# Patient Record
Sex: Female | Born: 1990
Health system: Southern US, Community
[De-identification: ages and names within clinical notes are randomized; demographics above are authoritative.]

## PROBLEM LIST (undated history)

## (undated) DIAGNOSIS — IMO0001 Reserved for inherently not codable concepts without codable children: Secondary | ICD-10-CM

## (undated) DIAGNOSIS — S92309A Fracture of unspecified metatarsal bone(s), unspecified foot, initial encounter for closed fracture: Secondary | ICD-10-CM

## (undated) DIAGNOSIS — K219 Gastro-esophageal reflux disease without esophagitis: Secondary | ICD-10-CM

## (undated) DIAGNOSIS — N809 Endometriosis, unspecified: Secondary | ICD-10-CM

## (undated) DIAGNOSIS — K449 Diaphragmatic hernia without obstruction or gangrene: Secondary | ICD-10-CM

## (undated) DIAGNOSIS — A64 Unspecified sexually transmitted disease: Secondary | ICD-10-CM

## (undated) DIAGNOSIS — K589 Irritable bowel syndrome without diarrhea: Secondary | ICD-10-CM

## (undated) DIAGNOSIS — F329 Major depressive disorder, single episode, unspecified: Secondary | ICD-10-CM

## (undated) DIAGNOSIS — F419 Anxiety disorder, unspecified: Secondary | ICD-10-CM

## (undated) DIAGNOSIS — F32A Depression, unspecified: Secondary | ICD-10-CM

## (undated) HISTORY — PX: OTHER SURGICAL HISTORY: SHX169

## (undated) HISTORY — DX: Endometriosis, unspecified: N80.9

## (undated) HISTORY — DX: Irritable bowel syndrome, unspecified: K58.9

## (undated) HISTORY — PX: WISDOM TOOTH EXTRACTION: SHX21

## (undated) HISTORY — DX: Unspecified sexually transmitted disease: A64

## (undated) HISTORY — PX: INTRAUTERINE DEVICE INSERTION: SHX323

---

## 2011-06-24 DIAGNOSIS — A64 Unspecified sexually transmitted disease: Secondary | ICD-10-CM

## 2011-06-24 HISTORY — DX: Unspecified sexually transmitted disease: A64

## 2011-11-01 DIAGNOSIS — M79609 Pain in unspecified limb: Secondary | ICD-10-CM | POA: Insufficient documentation

## 2011-11-01 DIAGNOSIS — X500XXA Overexertion from strenuous movement or load, initial encounter: Secondary | ICD-10-CM | POA: Insufficient documentation

## 2011-11-01 DIAGNOSIS — S92309A Fracture of unspecified metatarsal bone(s), unspecified foot, initial encounter for closed fracture: Secondary | ICD-10-CM | POA: Insufficient documentation

## 2011-11-01 DIAGNOSIS — R609 Edema, unspecified: Secondary | ICD-10-CM | POA: Insufficient documentation

## 2011-11-02 ENCOUNTER — Encounter (HOSPITAL_COMMUNITY): Payer: Self-pay | Admitting: *Deleted

## 2011-11-02 ENCOUNTER — Emergency Department (HOSPITAL_COMMUNITY): Payer: BC Managed Care – HMO

## 2011-11-02 ENCOUNTER — Emergency Department (HOSPITAL_COMMUNITY)
Admission: EM | Admit: 2011-11-02 | Discharge: 2011-11-02 | Disposition: A | Discharge status: Home / Self Care | Payer: BC Managed Care – HMO | Attending: Emergency Medicine | Admitting: Emergency Medicine

## 2011-11-02 DIAGNOSIS — S92353A Displaced fracture of fifth metatarsal bone, unspecified foot, initial encounter for closed fracture: Secondary | ICD-10-CM

## 2011-11-02 DIAGNOSIS — S92309A Fracture of unspecified metatarsal bone(s), unspecified foot, initial encounter for closed fracture: Secondary | ICD-10-CM

## 2011-11-02 HISTORY — DX: Fracture of unspecified metatarsal bone(s), unspecified foot, initial encounter for closed fracture: S92.309A

## 2011-11-02 MED ORDER — HYDROCODONE-ACETAMINOPHEN 5-325 MG PO TABS: 2.0000 | ORAL_TABLET | ORAL | Status: AC | PRN

## 2011-11-02 NOTE — ED Notes (Signed)
Educated regarding narcotics and driving.

## 2011-11-02 NOTE — ED Provider Notes (Signed)
History     CSN: [id number]  Arrival date & time 11/01/11  2235   First MD Initiated Contact with Patient 11/02/11 0250      Chief Complaint  Patient presents with  . Fall  . Foot Pain     HPI  History provided by the patient and significant other. Patient is 21 year old female with history of anxiety who presents with complaints of right foot pain and injury. Patient was out of the [employer] performance this evening and on the way home was walking in high heels and twisted her right foot and ankle. She complains of pain and bruising to the top of her right foot. Pain is worse with walking or pressure. Patient reports that she did become anxious and took one of her Clonopin following the injury. She denies any alcohol use or drug use this evening. She has not taken any other medications for her symptoms. Patient has been using ice while waiting in the emergency room. She reports pain is improving some. She denies any numbness or tingling in foot. Patient has no other significant past medical history.    History reviewed. No pertinent past medical history.  History reviewed. No pertinent past surgical history.  History reviewed. No pertinent family history.  History  Substance Use Topics  . Smoking status: Former Games developer  . Smokeless tobacco: Not on file  . Alcohol Use: No    OB History    Grav Para Term Preterm Abortions TAB SAB Ect Mult Living                  Review of Systems  All other systems reviewed and are negative.    Allergies  Review of patient's allergies indicates no known allergies.  Home Medications   Current Outpatient Rx  Name Route Sig Dispense Refill  . BUPROPION HCL 100 MG PO TABS Oral Take 100 mg by mouth 1 day or 1 dose.    Marland Kitchen CLONAZEPAM 0.5 MG PO TABS Oral Take 0.5 mg by mouth 2 (two) times daily as needed.    Marland Kitchen FLUVOXAMINE MALEATE 25 MG PO TABS Oral Take 25 mg by mouth at bedtime.    Marland Kitchen HYDROCODONE-ACETAMINOPHEN 5-325 MG PO TABS Oral  Take 1 tablet by mouth every 6 (six) hours as needed.      BP 125/79  Pulse 81  Temp(Src) 97.9 F (36.6 C) (Oral)  Resp 16  SpO2 100%  LMP 10/19/2011  Physical Exam  Nursing note and vitals reviewed. Constitutional: She is oriented to person, place, and time. She appears well-developed and well-nourished. No distress.  HENT:  Head: Normocephalic.  Cardiovascular: Normal rate and regular rhythm.   Pulmonary/Chest: Effort normal and breath sounds normal.  Abdominal: Soft.  Musculoskeletal: She exhibits edema and tenderness.       Mild bruising and diffuse swelling over the dorsal lateral aspect of right foot. Small abrasion near the fifth MTP joint. Patient has diffuse tenderness over the dorsal aspect of foot. Normal dorsal pedal pulses. Normal sensation and cap refill in toes. No tenderness to palpation over lateral ormedial malleolus.  Neurological: She is alert and oriented to person, place, and time.  Skin: Skin is warm and dry. No rash noted.  Psychiatric: She has a normal mood and affect. Her behavior is normal.    ED Course  Procedures (including critical care time)  Labs Reviewed - No data to display Dg Foot Complete Right  11/02/2011  *RADIOLOGY REPORT*  Clinical Data: The right foot  pain.  Third through fifth metatarsal pain.  RIGHT FOOT COMPLETE - 3+ VIEW  Comparison: None.  Findings: Pseudo Jones fracture of the right fifth metatarsal.  The fourth and third metatarsal bases appear within normal limits.  The tarsometatarsal junction appears normal.  Remainder of the foot appears normal.  Anatomic alignment.  Lateral foot soft tissue swelling is present.  IMPRESSION: Fifth metatarsal base pseudo fracture.  Original Report Authenticated By: Andreas Newport, M.D.     1. Closed fracture of base of fifth metatarsal bone       MDM  3:30AM patient seen and evaluated. Patient in no acute distress        Angus Seller, [place] 11/02/11 2003

## 2011-11-02 NOTE — ED Notes (Signed)
Pt reports she tripped and fell - injuring her rt foot and sustaining an abrasion to her left knee. Pt w/ mild contusion and swelling to dorsal rt foot. CMS intact.

## 2011-11-03 NOTE — ED Provider Notes (Signed)
Medical screening examination/treatment/procedure(s) were performed by non-physician practitioner and as supervising physician I was immediately available for consultation/collaboration.   Vida Roller, MD 11/03/11 [phone]

## 2012-01-12 ENCOUNTER — Encounter (HOSPITAL_BASED_OUTPATIENT_CLINIC_OR_DEPARTMENT_OTHER): Payer: Self-pay | Admitting: *Deleted

## 2012-01-17 NOTE — H&P (Signed)
[facility] [address]   [address] [place], [place] [address] [phone] A Division of [facility]  Loreta Ave, M.D.     Robert A. Thurston Hole, M.D.     Lunette Stands, M.D. Eulas Post, M.D.    Buford Dresser, M.D. Estell Harpin, M.D. Ralene Cork, D.O.          Genene Churn. Barry Dienes, PA-C            Kirstin A. Shepperson, PA-C Janace Litten, OPA-C   RE: Jane Gonzalez, [place]                                [id number]      DOB: [birth year] INITIAL EVALUATION:  11-10-11 Jane Gonzalez comes in as a new patient to the office for evaluation and treatment recommendation for a new injury to her right foot.  Referred from [facility].  She fell wearing high heels with a significant inversion injury, right foot.  Seen in the emergency room on November 02, 2011.  X-rays showing a minimally displaced fracture base of the fifth metatarsal in the Jones fracture area.  She has been in a boot weight bearing in a boot.  Continued pain.  She is concerned because she has now developed ecchymosis down into her toes and up around her foot.  Pain is in the 7 to 8 range.  She comes in today for evaluation and treatment recommendation.  Currently in school at [employer].   History is reviewed, updated and included in the chart.  She is in very good health.  EXAMINATION: General exam is outlined and included in the chart.  This is a healthy 21 year-old female.  Height: 5?0.  Weight: 100 pounds.  Neurovascularly intact upper and lower extremities.  Right foot exquisitely tender base of the fifth metatarsal.  Typical posttraumatic ecchymosis that goes to her heel and up to her toes.  She is neurovascularly intact.  Ankle has good motion and stability.    X-RAYS: I have reviewed the x-rays from [facility] and got new films today.  This is a fifth metatarsal Jones fracture.  Only minimally displaced.  It is however in the region that is a high likelihood of  non-union.  DISPOSITION:  I have explained the diagnosis and all treatment options to Jane Gonzalez in detail.  I have told her that this is a fracture that can heal on its own, but it is at risk for non-union.  If efforts are to be made to get this to heal on its own she needs to be in a well padded boot or cast strict non-weight bearing to get this thing to heal.  Even after 6-8 weeks of casting and being careful, she still has reasonable possibility of non-union or re-fracture in the future.  This approach is not unreasonable, but it demands her adhering to that protocol if it is going to have a chance of healing.  The other option is open reduction internal fixation with an intermedullary screw.  Risks, benefits, ratio of both approaches thoroughly outlined to her in detail.  She really doesn't feel that she is going to be able to be non-weight bearing and good enough to get this to heal with a conservative program.  She is opting for screw fixation, which after talking with her and going through the whole situation with her, I think is actually going to  work out better for her in the long run.  Procedures, risks, benefits and complications reviewed with her in detail.  She is moderately swollen now and I have told her that she really needs to keep her boot on, elevate as much as possible and let her swelling settle down before we proceed.  I filled out all paperwork.  I have answered all of her questions.  I refilled her medication of Percocet that I want her to use sparingly until operative intervention.  We are going to set this up for about 7 days from now. I will see her at that time.  She will call for issues or problems in the interim.  I have again reinforced to her that if she wants to try the conservative approach that is not wrong, but she needs to adhere to that if she is going to go in that direction.    Loreta Ave, M.D.   Electronically verified by Loreta Ave, M.D. DFM:jjh D  11-10-11 T 11-13-11  Rondalyn Belford/[facility] [address]   [address] [place], [place] [address] [phone] A Division of [facility]  Loreta Ave, M.D.     Robert A. Thurston Hole, M.D.     Lunette Stands, M.D. Eulas Post, M.D.    Buford Dresser, M.D. Estell Harpin, M.D. Ralene Cork, D.O.          Genene Churn. Barry Dienes, PA-C            Kirstin A. Shepperson, PA-C [place], OPA-C   RE: Jane Gonzalez, Jane Gonzalez   [id number]      DOB: 1991-06-04 PROGRESS NOTE: 01-05-12 21 year old white female returns for evaluation of right foot pain. She's 8 weeks out from proximal 5th metatarsal fracture continues to have pain and feels like the bone is moving. She wants to proceed with open reduction internal fixation that we previously discussed.  EXAMINATION: Alert and oriented x3 in no acute distress. Gait is antalgic. Right foot has minimal swelling laterally. No bruising. She has exquisite tenderness over proximal 5th metatarsal. She's neurovascularly intact. Skin warm and dry. No increase in respiratory effort.   X-RAYS: Right foot AP lateral and oblique views unchanged fracture not completely healed.  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  DISPOSITION: Best treatment would be open reduction internal fixation right 5th metatarsal fracture. Discussed risks benefits and possible complications in detail. All questions answered. She will be non-weightbearing for 6 weeks. Will also use a bone stimulator post-op.   Loreta Ave, M.D.  Electronically verified by Loreta Ave, M.D. DFM(JMO):kh D 01-07-02 T 01-07-02

## 2012-01-18 ENCOUNTER — Encounter (HOSPITAL_BASED_OUTPATIENT_CLINIC_OR_DEPARTMENT_OTHER): Payer: Self-pay | Admitting: Anesthesiology

## 2012-01-18 ENCOUNTER — Encounter (HOSPITAL_BASED_OUTPATIENT_CLINIC_OR_DEPARTMENT_OTHER): Payer: Self-pay | Admitting: *Deleted

## 2012-01-18 ENCOUNTER — Encounter (HOSPITAL_BASED_OUTPATIENT_CLINIC_OR_DEPARTMENT_OTHER)
Admission: RE | Disposition: A | Discharge status: Home / Self Care | Payer: Self-pay | Source: Ambulatory Visit | Attending: Orthopedic Surgery

## 2012-01-18 ENCOUNTER — Ambulatory Visit (HOSPITAL_BASED_OUTPATIENT_CLINIC_OR_DEPARTMENT_OTHER)
Admission: RE | Admit: 2012-01-18 | Discharge: 2012-01-18 | Disposition: A | Discharge status: Home / Self Care | Payer: BC Managed Care – HMO | Source: Ambulatory Visit | Attending: Orthopedic Surgery | Admitting: Orthopedic Surgery

## 2012-01-18 ENCOUNTER — Ambulatory Visit (HOSPITAL_BASED_OUTPATIENT_CLINIC_OR_DEPARTMENT_OTHER): Payer: BC Managed Care – HMO | Admitting: Anesthesiology

## 2012-01-18 DIAGNOSIS — S8290XS Unspecified fracture of unspecified lower leg, sequela: Secondary | ICD-10-CM | POA: Insufficient documentation

## 2012-01-18 DIAGNOSIS — X58XXXS Exposure to other specified factors, sequela: Secondary | ICD-10-CM | POA: Insufficient documentation

## 2012-01-18 DIAGNOSIS — Z4789 Encounter for other orthopedic aftercare: Secondary | ICD-10-CM

## 2012-01-18 DIAGNOSIS — IMO0002 Reserved for concepts with insufficient information to code with codable children: Secondary | ICD-10-CM | POA: Insufficient documentation

## 2012-01-18 HISTORY — DX: Gastro-esophageal reflux disease without esophagitis: K21.9

## 2012-01-18 HISTORY — DX: Major depressive disorder, single episode, unspecified: F32.9

## 2012-01-18 HISTORY — PX: ORIF TOE FRACTURE: SHX5032

## 2012-01-18 HISTORY — DX: Fracture of unspecified metatarsal bone(s), unspecified foot, initial encounter for closed fracture: S92.309A

## 2012-01-18 HISTORY — DX: Reserved for inherently not codable concepts without codable children: IMO0001

## 2012-01-18 HISTORY — DX: Depression, unspecified: F32.A

## 2012-01-18 HISTORY — DX: Anxiety disorder, unspecified: F41.9

## 2012-01-18 HISTORY — DX: Diaphragmatic hernia without obstruction or gangrene: K44.9

## 2012-01-18 SURGERY — OPEN REDUCTION INTERNAL FIXATION (ORIF) METATARSAL (TOE) FRACTURE
Anesthesia: General | Site: Foot | Laterality: Right | Wound class: Clean

## 2012-01-18 MED ORDER — BUPIVACAINE HCL (PF) 0.5 % IJ SOLN
INTRAMUSCULAR | Status: DC | PRN
  Administered 2012-01-18: 5 mL

## 2012-01-18 MED ORDER — LORAZEPAM 2 MG/ML IJ SOLN: 1.0000 mg | Freq: Once | INTRAMUSCULAR | Status: DC | PRN

## 2012-01-18 MED ORDER — CEFAZOLIN SODIUM 1-5 GM-% IV SOLN
1.0000 g | INTRAVENOUS | Status: AC
  Administered 2012-01-18: 1 g via INTRAVENOUS

## 2012-01-18 MED ORDER — LIDOCAINE HCL (CARDIAC) 20 MG/ML IV SOLN
INTRAVENOUS | Status: DC | PRN
  Administered 2012-01-18: 50 mg via INTRAVENOUS

## 2012-01-18 MED ORDER — PROPOFOL 10 MG/ML IV EMUL
INTRAVENOUS | Status: DC | PRN
  Administered 2012-01-18: 160 mg via INTRAVENOUS

## 2012-01-18 MED ORDER — HYDROMORPHONE HCL PF 1 MG/ML IJ SOLN
0.2500 mg | INTRAMUSCULAR | Status: DC | PRN
  Administered 2012-01-18 (×2): 0.5 mg via INTRAVENOUS

## 2012-01-18 MED ORDER — HYDROCODONE-ACETAMINOPHEN 5-325 MG PO TABS
1.0000 | ORAL_TABLET | Freq: Once | ORAL | Status: AC
  Administered 2012-01-18: 1 via ORAL

## 2012-01-18 MED ORDER — DEXAMETHASONE SODIUM PHOSPHATE 4 MG/ML IJ SOLN
INTRAMUSCULAR | Status: DC | PRN
  Administered 2012-01-18: 8 mg via INTRAVENOUS

## 2012-01-18 MED ORDER — FENTANYL CITRATE 0.05 MG/ML IJ SOLN
INTRAMUSCULAR | Status: DC | PRN
  Administered 2012-01-18: 100 ug via INTRAVENOUS

## 2012-01-18 MED ORDER — LACTATED RINGERS IV SOLN
INTRAVENOUS | Status: DC
  Administered 2012-01-18 (×2): via INTRAVENOUS

## 2012-01-18 MED ORDER — MIDAZOLAM HCL 5 MG/5ML IJ SOLN
INTRAMUSCULAR | Status: DC | PRN
  Administered 2012-01-18: 1 mg via INTRAVENOUS

## 2012-01-18 MED ORDER — ONDANSETRON HCL 4 MG/2ML IJ SOLN
INTRAMUSCULAR | Status: DC | PRN
  Administered 2012-01-18: 4 mg via INTRAVENOUS

## 2012-01-18 MED ORDER — FENTANYL CITRATE 0.05 MG/ML IJ SOLN: 50.0000 ug | INTRAMUSCULAR | Status: DC | PRN

## 2012-01-18 MED ORDER — MIDAZOLAM HCL 2 MG/2ML IJ SOLN: 1.0000 mg | INTRAMUSCULAR | Status: DC | PRN

## 2012-01-18 SURGICAL SUPPLY — 62 items
BANDAGE ELASTIC 4 VELCRO ST LF (GAUZE/BANDAGES/DRESSINGS) ×2 IMPLANT
BANDAGE ELASTIC 6 VELCRO ST LF (GAUZE/BANDAGES/DRESSINGS) ×2 IMPLANT
BENZOIN TINCTURE PRP APPL 2/3 (GAUZE/BANDAGES/DRESSINGS) ×2 IMPLANT
BIT DRILL CANN 3.2MM (BIT) ×1 IMPLANT
BLADE SURG 15 STRL LF DISP TIS (BLADE) ×1 IMPLANT
BLADE SURG 15 STRL SS (BLADE) ×1
BNDG COHESIVE 4X5 TAN STRL (GAUZE/BANDAGES/DRESSINGS) ×2 IMPLANT
BNDG ESMARK 4X9 LF (GAUZE/BANDAGES/DRESSINGS) ×2 IMPLANT
CLOTH BEACON ORANGE TIMEOUT ST (SAFETY) ×2 IMPLANT
COVER MAYO STAND STRL (DRAPES) ×2 IMPLANT
COVER TABLE BACK 60X90 (DRAPES) ×2 IMPLANT
CUFF TOURNIQUET SINGLE 24IN (TOURNIQUET CUFF) ×2 IMPLANT
CUFF TOURNIQUET SINGLE 34IN LL (TOURNIQUET CUFF) IMPLANT
DECANTER SPIKE VIAL GLASS SM (MISCELLANEOUS) IMPLANT
DRAPE EXTREMITY T 121X128X90 (DRAPE) ×2 IMPLANT
DRAPE OEC MINIVIEW 54X84 (DRAPES) ×2 IMPLANT
DRAPE SURG 17X23 STRL (DRAPES) IMPLANT
DRAPE U 20/CS (DRAPES) ×2 IMPLANT
DRAPE U-SHAPE 47X51 STRL (DRAPES) IMPLANT
DRILL BIT CANN 3.2MM (BIT) ×1
DURAPREP 26ML APPLICATOR (WOUND CARE) ×2 IMPLANT
ELECT REM PT RETURN 9FT ADLT (ELECTROSURGICAL)
ELECTRODE REM PT RTRN 9FT ADLT (ELECTROSURGICAL) IMPLANT
GAUZE SPONGE 4X4 12PLY STRL LF (GAUZE/BANDAGES/DRESSINGS) ×2 IMPLANT
GAUZE XEROFORM 1X8 LF (GAUZE/BANDAGES/DRESSINGS) ×2 IMPLANT
GLOVE BIOGEL PI IND STRL 8 (GLOVE) ×2 IMPLANT
GLOVE BIOGEL PI INDICATOR 8 (GLOVE) ×2
GLOVE ORTHO TXT STRL SZ7.5 (GLOVE) ×4 IMPLANT
GLOVE SURG SS PI 8.0 STRL IVOR (GLOVE) ×2 IMPLANT
GOWN BRE IMP PREV XXLGXLNG (GOWN DISPOSABLE) ×4 IMPLANT
GOWN PREVENTION PLUS XLARGE (GOWN DISPOSABLE) ×2 IMPLANT
GUIDEWIRE THREADED 1.6 (WIRE) ×2 IMPLANT
NEEDLE HYPO 25X1 1.5 SAFETY (NEEDLE) ×2 IMPLANT
NS IRRIG 1000ML POUR BTL (IV SOLUTION) ×2 IMPLANT
PACK BASIN DAY SURGERY FS (CUSTOM PROCEDURE TRAY) ×2 IMPLANT
PAD CAST 4YDX4 CTTN HI CHSV (CAST SUPPLIES) ×1 IMPLANT
PADDING CAST ABS 4INX4YD NS (CAST SUPPLIES) ×1
PADDING CAST ABS COTTON 4X4 ST (CAST SUPPLIES) ×1 IMPLANT
PADDING CAST COTTON 4X4 STRL (CAST SUPPLIES) ×1
PADDING CAST COTTON 6X4 STRL (CAST SUPPLIES) ×2 IMPLANT
PENCIL BUTTON HOLSTER BLD 10FT (ELECTRODE) ×2 IMPLANT
SCREW CANN P THRD/40 4.5 (Screw) ×2 IMPLANT
SLEEVE SCD COMPRESS KNEE MED (MISCELLANEOUS) IMPLANT
SPLINT PLASTER CAST XFAST 3X15 (CAST SUPPLIES) IMPLANT
SPLINT PLASTER XTRA FASTSET 3X (CAST SUPPLIES)
SPONGE GAUZE 4X4 12PLY (GAUZE/BANDAGES/DRESSINGS) ×2 IMPLANT
SPONGE LAP 4X18 X RAY DECT (DISPOSABLE) ×2 IMPLANT
STOCKINETTE 4X48 STRL (DRAPES) IMPLANT
STOCKINETTE 6  STRL (DRAPES) ×1
STOCKINETTE 6 STRL (DRAPES) ×1 IMPLANT
STRIP CLOSURE SKIN 1/2X4 (GAUZE/BANDAGES/DRESSINGS) ×2 IMPLANT
SUT ETHILON 3 0 PS 1 (SUTURE) IMPLANT
SUT MON AB 4-0 PC3 18 (SUTURE) IMPLANT
SUT VIC AB 2-0 SH 27 (SUTURE)
SUT VIC AB 2-0 SH 27XBRD (SUTURE) IMPLANT
SUT VIC AB 3-0 FS2 27 (SUTURE) IMPLANT
SYR 20CC LL (SYRINGE) ×2 IMPLANT
SYR BULB 3OZ (MISCELLANEOUS) ×2 IMPLANT
TOWEL OR 17X24 6PK STRL BLUE (TOWEL DISPOSABLE) ×2 IMPLANT
TUBE CONNECTING 20X1/4 (TUBING) IMPLANT
UNDERPAD 30X30 INCONTINENT (UNDERPADS AND DIAPERS) ×2 IMPLANT
WATER STERILE IRR 1000ML POUR (IV SOLUTION) ×2 IMPLANT

## 2012-01-18 NOTE — Interval H&P Note (Signed)
History and Physical Interval Note:  01/18/2012 7:36 AM  Jane Gonzalez  has presented today for surgery, with the diagnosis of right 5th toe fracture  The various methods of treatment have been discussed with the patient and family. After consideration of risks, benefits and other options for treatment, the patient has consented to  Procedure(s) (LRB): OPEN REDUCTION INTERNAL FIXATION (ORIF) METATARSAL (TOE) FRACTURE (Right) as a surgical intervention .  The patients' history has been reviewed, patient examined, no change in status, stable for surgery.  I have reviewed the patients' chart and labs.  Questions were answered to the patient's satisfaction.     MURPHY,DANIEL F

## 2012-01-18 NOTE — Anesthesia Procedure Notes (Signed)
Procedure Name: LMA Insertion Performed by: Lance Coon Pre-anesthesia Checklist: Patient identified, Timeout performed, Emergency Drugs available, Suction available and Patient being monitored Oxygen Delivery Method: Circle system utilized Preoxygenation: Pre-oxygenation with 100% oxygen Intubation Type: IV induction Ventilation: Mask ventilation without difficulty LMA: LMA inserted LMA Size: 3.0 Number of attempts: 1 Placement Confirmation: positive ETCO2 and breath sounds checked- equal and bilateral Dental Injury: Teeth and Oropharynx as per pre-operative assessment

## 2012-01-18 NOTE — Anesthesia Preprocedure Evaluation (Signed)
Anesthesia Evaluation  Patient identified by MRN, date of birth, ID band Patient awake    Reviewed: Allergy & Precautions, H&P , NPO status , Patient's Chart, lab work & pertinent test results  Airway Mallampati: I TM Distance: >3 FB Neck ROM: Full    Dental   Pulmonary    Pulmonary exam normal       Cardiovascular     Neuro/Psych Anxiety Depression    GI/Hepatic   Endo/Other    Renal/GU      Musculoskeletal   Abdominal   Peds  Hematology   Anesthesia Other Findings   Reproductive/Obstetrics                           Anesthesia Physical Anesthesia Plan  ASA: II  Anesthesia Plan: General   Post-op Pain Management:    Induction: Intravenous  Airway Management Planned: LMA  Additional Equipment:   Intra-op Plan:   Post-operative Plan: Extubation in OR  Informed Consent: I have reviewed the patients History and Physical, chart, labs and discussed the procedure including the risks, benefits and alternatives for the proposed anesthesia with the patient or authorized representative who has indicated his/her understanding and acceptance.     Plan Discussed with: CRNA and Surgeon  Anesthesia Plan Comments:         Anesthesia Quick Evaluation

## 2012-01-18 NOTE — Discharge Instructions (Signed)
[facility]  [address] [place], [place] [address] [phone]   Post Anesthesia Home Care Instructions  Activity: Get plenty of rest for the remainder of the day. A responsible adult should stay with you for 24 hours following the procedure.  For the next 24 hours, DO NOT: -Drive a car -Operate machinery -Drink alcoholic beverages -Take any medication unless instructed by your physician -Make any legal decisions or sign important papers.  Meals: Start with liquid foods such as gelatin or soup. Progress to regular foods as tolerated. Avoid greasy, spicy, heavy foods. If nausea and/or vomiting occur, drink only clear liquids until the nausea and/or vomiting subsides. Call your physician if vomiting continues.  Special Instructions/Symptoms: Your throat may feel dry or sore from the anesthesia or the breathing tube placed in your throat during surgery. If this causes discomfort, gargle with warm salt water. The discomfort should disappear within 24 hours.   

## 2012-01-18 NOTE — Brief Op Note (Signed)
01/18/2012  12:58 PM  PATIENT:  Jane Gonzalez  21 y.o. female  PRE-OPERATIVE DIAGNOSIS:  right 5th toe fracture  POST-OPERATIVE DIAGNOSIS:  right 5th toe fracture  PROCEDURE:  Procedure(s) (LRB): OPEN REDUCTION INTERNAL FIXATION (ORIF) METATARSAL (TOE) FRACTURE (Right)  SURGEON:  Surgeon(s) and Role:    * Loreta Ave, MD - Primary  PHYSICIAN ASSISTANT: Zonia Kief M    ANESTHESIA:   general  EBL:  Total I/O In: 1150 [I.V.:1150] Out: -     SPECIMEN:  No Specimen  DISPOSITION OF SPECIMEN:  N/A  COUNTS:  YES  TOURNIQUET:   Total Tourniquet Time Documented: Thigh (Right) - 26 minutes   PATIENT DISPOSITION:  PACU - hemodynamically stable.

## 2012-01-18 NOTE — Anesthesia Postprocedure Evaluation (Signed)
  Anesthesia Post-op Note  Patient: Jane Gonzalez  Procedure(s) Performed: Procedure(s) (LRB): OPEN REDUCTION INTERNAL FIXATION (ORIF) METATARSAL (TOE) FRACTURE (Right)  Patient Location: PACU  Anesthesia Type: General  Level of Consciousness: awake  Airway and Oxygen Therapy: Patient Spontanous Breathing  Post-op Pain: mild  Post-op Assessment: Post-op Vital signs reviewed, Patient's Cardiovascular Status Stable, Respiratory Function Stable, Patent Airway, No signs of Nausea or vomiting and Pain level controlled  Post-op Vital Signs: stable  Complications: No apparent anesthesia complications

## 2012-01-18 NOTE — Transfer of Care (Signed)
Immediate Anesthesia Transfer of Care Note  Patient: Jane Gonzalez  Procedure(s) Performed: Procedure(s) (LRB): OPEN REDUCTION INTERNAL FIXATION (ORIF) METATARSAL (TOE) FRACTURE (Right)  Patient Location: PACU  Anesthesia Type: General  Level of Consciousness: awake and sedated  Airway & Oxygen Therapy: Patient Spontanous Breathing and Patient connected to face mask oxygen  Post-op Assessment: Report given to PACU RN and Post -op Vital signs reviewed and stable  Post vital signs: Reviewed and stable  Complications: No apparent anesthesia complications

## 2012-01-20 NOTE — Op Note (Signed)
NAME:  Jane Gonzalez, Jane Gonzalez                     ACCOUNT NO.:  MEDICAL RECORD NO.:  [id number]  LOCATION:                                 FACILITY:  PHYSICIAN:  Loreta Ave, M.D. DATE OF BIRTH:  June 18, 1991  DATE OF PROCEDURE:  01/18/2012 DATE OF DISCHARGE:                              OPERATIVE REPORT   PREOPERATIVE DIAGNOSIS:  Delayed union of proximal shaft fracture, 5th metatarsal, right foot.  POSTOPERATIVE DIAGNOSIS:  Delayed union of proximal shaft fracture, 5th metatarsal, right foot.  PROCEDURE:  Open reduction and internal fixation, 5th metatarsal delayed union with multiple drilling across the fracture site.  Fixation with a partially threaded, cannulated 4.5-mm screw, 40 mm long.  SURGEON:  Loreta Ave, MD  ASSISTANT:  Zonia Kief, PA  ANESTHESIA:  General.  BLOOD LOSS:  Minimal.  SPECIMENS:  None.  CULTURES:  None.  COMPLICATION:  None.  DRESSINGS:  Soft compressive short leg splint.  TOURNIQUET TIME:  35 minutes.  PROCEDURE:  The patient was brought to the operating room, placed on the operating table in supine position.  After adequate anesthesia has been obtained, tourniquet was applied to the upper aspect of the right leg. Prepped draped in the usual sterile fashion.  Exsanguinated with elevation and Esmarch, tourniquet inflated to 300 mmHg.  Fluoroscopic guidance was used throughout.  Longitudinal incision at the base of 5th metatarsal, right foot.  Blunt dissection was used to expose the end of the metatarsal.  Fluoroscopic guidance.  A guidewire was then passed across the fracture multiple times to open this to improve healing of the delayed union.  I then put a guidewire down the shaft, confirming good position on all views.  Predrilled.  Countersunk.  Fixed with a 40- mm, partially threaded, cannulated 4.5-mm screw.  Nice, firm, solid, stable fixation and anatomic alignment was confirmed.  Wound was irrigated, closed with nylon.  Sterile  compressive dressing was applied. Short-leg splint was applied.  Tourniquet was deflated and removed. Anesthesia reversed.  Brought to the recovery room.  Tolerated the surgery well with no complications.     Loreta Ave, M.D.     DFM/MEDQ  D:  01/18/2012  T:  01/19/2012  Job:  [id number]

## 2012-01-23 ENCOUNTER — Encounter (HOSPITAL_BASED_OUTPATIENT_CLINIC_OR_DEPARTMENT_OTHER): Payer: Self-pay | Admitting: Orthopedic Surgery

## 2013-04-17 ENCOUNTER — Encounter: Payer: Self-pay | Admitting: Obstetrics and Gynecology

## 2013-04-21 ENCOUNTER — Encounter: Payer: Self-pay | Admitting: Obstetrics and Gynecology

## 2013-04-21 ENCOUNTER — Ambulatory Visit (INDEPENDENT_AMBULATORY_CARE_PROVIDER_SITE_OTHER): Payer: BC Managed Care – PPO | Admitting: Obstetrics and Gynecology

## 2013-04-21 VITALS — BP 108/60 | HR 66 | Resp 12 | Ht 61.0 in | Wt 122.2 lb

## 2013-04-21 DIAGNOSIS — Z0000 Encounter for general adult medical examination without abnormal findings: Secondary | ICD-10-CM

## 2013-04-21 DIAGNOSIS — Z01419 Encounter for gynecological examination (general) (routine) without abnormal findings: Secondary | ICD-10-CM

## 2013-04-21 LAB — POCT URINALYSIS DIPSTICK: Leukocytes, UA: NEGATIVE

## 2013-04-21 LAB — STD PANEL
HIV: NONREACTIVE
Hepatitis B Surface Ag: NEGATIVE

## 2013-04-21 NOTE — Progress Notes (Signed)
22 y.o. G0P0000 SingleCaucasianF here for annual exam.  Had implanon put in around Oct 2013.  Menses are irregular, some spotting, sometimes no bleeding.  Requests STD testing.    Patient's last menstrual period was 03/16/2013.          Sexually active: yes  The current method of family planning is Implannon.    Exercising: yes  pt has cardio workout routine Smoker:  yes  Health Maintenance: Pap:  never History of abnormal Pap:  no MMG:  never Colonoscopy:  2012 BMD:   never TDaP:  2013, per pt.  Screening Labs: , Hb today: 13.3 , Urine today:    reports that she has been smoking.  She has never used smokeless tobacco. She reports that she drinks about 1.5 ounces of alcohol per week. She reports that she does not use illicit drugs.  Past Medical History  Diagnosis Date  . Hiatal hernia   . Reflux   . Anxiety   . Depression   . Fx metatarsal 11/02/2011    right 5th toe  . IBS (irritable bowel syndrome)   . STD (sexually transmitted disease) 06/2011    + chlamydia    Past Surgical History  Procedure Laterality Date  . Wisdom tooth extraction    . Orif toe fracture  01/18/2012    Procedure: OPEN REDUCTION INTERNAL FIXATION (ORIF) METATARSAL (TOE) FRACTURE;  Surgeon: Loreta Ave, MD;  Location: [facility];  Service: Orthopedics;  Laterality: Right;  right 5th toe  . Implannon      Current Outpatient Prescriptions  Medication Sig Dispense Refill  . clonazePAM (KLONOPIN) 1 MG tablet Take 1 mg by mouth.      . DULoxetine (CYMBALTA) 30 MG capsule Take 30 mg by mouth daily.      Marland Kitchen etonogestrel (IMPLANON) 68 MG IMPL implant Inject 1 each into the skin once.       No current facility-administered medications for this visit.    No family history on file.  ROS:  Pertinent items are noted in HPI.  Otherwise, a comprehensive ROS was negative.  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    Exam:   BP 108/60  Pulse 66   Resp 12  Ht 5\' 1"  (1.549 m)  Wt 122 lb 3.2 oz (55.43 kg)  BMI 23.1 kg/m2  LMP 03/16/2013  Weight change: @WEIGHTCHANGE @ Height:   Height: 5\' 1"  (154.9 cm)  Ht Readings from Last 3 Encounters:  04/21/13 5\' 1"  (1.549 m)  01/12/12 5' (1.524 m) (5%*, Z = -1.68)  01/12/12 5' (1.524 m) (5%*, Z = -1.68)   * Growth percentiles are based on CDC 2-20 Years data.    General appearance: alert, cooperative and appears stated age.  Lime green and bright purple hair.  Piercings through nasal septum and cheeks on face, and both nipples.   Head: Normocephalic, without obvious abnormality, atraumatic Neck: no adenopathy, supple, symmetrical, trachea midline and thyroid normal to inspection and palpation Lungs: clear to auscultation bilaterally Breasts: normal appearance, no masses or tenderness, Inspection negative, No nipple retraction or dimpling, Normal to palpation without dominant masses Heart: regular rate and rhythm Abdomen: soft, non-tender; bowel sounds normal; no masses,  no organomegaly Extremities: extremities normal, atraumatic, no cyanosis or edema Skin: Skin color, texture, turgor normal. No rashes or lesions Lymph nodes: Cervical, supraclavicular, and axillary nodes normal. No abnormal inguinal nodes palpated Neurologic: Grossly normal   Pelvic: External genitalia:  no lesions              Urethra:  normal appearing urethra with no masses, tenderness or lesions              Bartholins and Skenes: normal                 Vagina: normal appearing vagina with normal color and discharge, no lesions              Cervix: no lesions              Pap taken: no Bimanual Exam:  Uterus:  normal size, contour, position, consistency, mobility, non-tender              Adnexa: normal adnexa               Rectovaginal: Confirms               Anus:  normal sphincter tone, no lesions  A:  Well Woman with normal exam, implanon             Requests STD check  P:   mammogram counseled on breast  self exam, STD prevention, diet and exercise return annually or prn.  Check GC, CHL, HIV, RPR, Hep B  An After Visit Summary was printed and given to the patient.

## 2013-04-21 NOTE — Patient Instructions (Addendum)
EXERCISE AND DIET:  We recommended that you start or continue a regular exercise program for good health. Regular exercise means any activity that makes your heart beat faster and makes you sweat.  We recommend exercising at least 30 minutes per day at least 3 days a week, preferably 4 or 5.  We also recommend a diet low in fat and sugar.  Inactivity, poor dietary choices and obesity can cause diabetes, heart attack, stroke, and kidney damage, among others.    ALCOHOL AND SMOKING:  Women should limit their alcohol intake to no more than 7 drinks/beers/glasses of wine (combined, not each!) per week. Moderation of alcohol intake to this level decreases your risk of breast cancer and liver damage. And of course, no recreational drugs are part of a healthy lifestyle.  And absolutely no smoking or even second hand smoke. Most people know smoking can cause heart and lung diseases, but did you know it also contributes to weakening of your bones? Aging of your skin?  Yellowing of your teeth and nails?  CALCIUM AND VITAMIN D:  Adequate intake of calcium and Vitamin D are recommended.  The recommendations for exact amounts of these supplements seem to change often, but generally speaking 600 mg of calcium (either carbonate or citrate) and 800 units of Vitamin D per day seems prudent. Certain women may benefit from higher intake of Vitamin D.  If you are among these women, your doctor will have told you during your visit.    PAP SMEARS:  Pap smears, to check for cervical cancer or precancers,  have traditionally been done yearly, although recent scientific advances have shown that most women can have pap smears less often.  However, every woman still should have a physical exam from her gynecologist every year. It will include a breast check, inspection of the vulva and vagina to check for abnormal growths or skin changes, a visual exam of the cervix, and then an exam to evaluate the size and shape of the uterus and  ovaries.  And after 21 years of age, a rectal exam is indicated to check for rectal cancers. We will also provide age appropriate advice regarding health maintenance, like when you should have certain vaccines, screening for sexually transmitted diseases, bone density testing, colonoscopy, mammograms, etc.      

## 2013-04-22 LAB — GC/CHLAMYDIA PROBE AMP, URINE: Chlamydia, Swab/Urine, PCR: NEGATIVE

## 2013-04-23 ENCOUNTER — Telehealth: Payer: Self-pay | Admitting: *Deleted

## 2013-04-23 NOTE — Telephone Encounter (Signed)
Message copied by Lorraine Lax on Wed Apr 23, 2013 10:18 AM ------      Message from: Meredeth Ide P      Created: Tue Apr 22, 2013  2:19 PM       Let pt know these are all negative. ------

## 2013-04-23 NOTE — Telephone Encounter (Signed)
Left Message To Call Back  

## 2013-04-24 NOTE — Telephone Encounter (Signed)
Pt notified 04/23/13 see labs

## 2014-04-03 ENCOUNTER — Encounter: Payer: Self-pay | Admitting: Certified Nurse Midwife

## 2014-04-03 ENCOUNTER — Ambulatory Visit (INDEPENDENT_AMBULATORY_CARE_PROVIDER_SITE_OTHER): Payer: BC Managed Care – PPO | Admitting: Certified Nurse Midwife

## 2014-04-03 ENCOUNTER — Ambulatory Visit: Payer: BC Managed Care – PPO | Admitting: Certified Nurse Midwife

## 2014-04-03 VITALS — Ht 61.0 in | Wt 127.0 lb

## 2014-04-03 DIAGNOSIS — N390 Urinary tract infection, site not specified: Secondary | ICD-10-CM

## 2014-04-03 DIAGNOSIS — Z202 Contact with and (suspected) exposure to infections with a predominantly sexual mode of transmission: Secondary | ICD-10-CM

## 2014-04-03 LAB — POCT URINALYSIS DIPSTICK
BILIRUBIN UA: NEGATIVE
Blood, UA: NEGATIVE
Glucose, UA: NEGATIVE
KETONES UA: NEGATIVE
NITRITE UA: NEGATIVE
PH UA: 5
PROTEIN UA: NEGATIVE
Urobilinogen, UA: NEGATIVE

## 2014-04-03 MED ORDER — NITROFURANTOIN MONOHYD MACRO 100 MG PO CAPS: 100.0000 mg | ORAL_CAPSULE | Freq: Two times a day (BID) | ORAL | Status: DC

## 2014-04-03 NOTE — Progress Notes (Signed)
23 y.o.single white female g0p0 here with complaint of UTI, with onset  on 4-5 days ago. Patient complaining of urinary frequency/urgency/ and slight pain with urination. Patient denies fever, chills, nausea or back pain. No new personal products. Patient feels not related to sexual activity. Denies any vaginal symptoms or new personal products. Contraception is Implanon. Patient desires STD screening.   O: Healthy female WDWN Affect: Normal, orientation x 3 Skin : warm and dry CVAT: negative bilateral Abdomen: positive for suprapubic tenderness  Pelvic exam: External genital area: normal, no lesions Bladder,Urethra, Urethral meatus: tender Vagina: normal vaginal discharge, normal appearance  Wet prep not taken Cervix: normal, non tender Uterus:normal,non tender Adnexa: normal non tender, no fullness or masses   A: UTI Poct urine-wbc 1+ STD screening Aex due this month  P: Reviewed findings of UTI and etiology. ZO:XWRUEAVWRx:Macrobid UJW:JXBJYLab:Urine micro, culture Reviewed warning signs and symptoms of UTI Encouraged to limit soda, tea, and coffee, increase water intake Labs: GC,Chlamydia,HIV,RPR Discussed STD prevention with condom use. Discussed aex due 04/21/14. Patient plans to schedule while she is here.  RV 2 week if TOC needed for positive culture  RV prn

## 2014-04-03 NOTE — Patient Instructions (Signed)
Urinary Tract Infection  Urinary tract infections (UTIs) can develop anywhere along your urinary tract. Your urinary tract is your body's drainage system for removing wastes and extra water. Your urinary tract includes two kidneys, two ureters, a bladder, and a urethra. Your kidneys are a pair of bean-shaped organs. Each kidney is about the size of your fist. They are located below your ribs, one on each side of your spine.  CAUSES  Infections are caused by microbes, which are microscopic organisms, including fungi, viruses, and bacteria. These organisms are so small that they can only be seen through a microscope. Bacteria are the microbes that most commonly cause UTIs.  SYMPTOMS   Symptoms of UTIs may vary by age and gender of the patient and by the location of the infection. Symptoms in young women typically include a frequent and intense urge to urinate and a painful, burning feeling in the bladder or urethra during urination. Older women and men are more likely to be tired, shaky, and weak and have muscle aches and abdominal pain. A fever may mean the infection is in your kidneys. Other symptoms of a kidney infection include pain in your back or sides below the ribs, nausea, and vomiting.  DIAGNOSIS  To diagnose a UTI, your caregiver will ask you about your symptoms. Your caregiver also will ask to provide a urine sample. The urine sample will be tested for bacteria and white blood cells. White blood cells are made by your body to help fight infection.  TREATMENT   Typically, UTIs can be treated with medication. Because most UTIs are caused by a bacterial infection, they usually can be treated with the use of antibiotics. The choice of antibiotic and length of treatment depend on your symptoms and the type of bacteria causing your infection.  HOME CARE INSTRUCTIONS   If you were prescribed antibiotics, take them exactly as your caregiver instructs you. Finish the medication even if you feel better after you  have only taken some of the medication.   Drink enough water and fluids to keep your urine clear or pale yellow.   Avoid caffeine, tea, and carbonated beverages. They tend to irritate your bladder.   Empty your bladder often. Avoid holding urine for long periods of time.   Empty your bladder before and after sexual intercourse.   After a bowel movement, women should cleanse from front to back. Use each tissue only once.  SEEK MEDICAL CARE IF:    You have back pain.   You develop a fever.   Your symptoms do not begin to resolve within 3 days.  SEEK IMMEDIATE MEDICAL CARE IF:    You have severe back pain or lower abdominal pain.   You develop chills.   You have nausea or vomiting.   You have continued burning or discomfort with urination.  MAKE SURE YOU:    Understand these instructions.   Will watch your condition.   Will get help right away if you are not doing well or get worse.  Document Released: 07/19/2005 Document Revised: 04/09/2012 Document Reviewed: 11/17/2011  [employer] Patient Information 2014 [employer].

## 2014-04-04 LAB — RPR

## 2014-04-04 LAB — URINALYSIS, MICROSCOPIC ONLY
Bacteria, UA: NONE SEEN
CASTS: NONE SEEN
Crystals: NONE SEEN
SQUAMOUS EPITHELIAL / LPF: NONE SEEN

## 2014-04-04 LAB — HIV ANTIBODY (ROUTINE TESTING W REFLEX): HIV: NONREACTIVE

## 2014-04-04 NOTE — Progress Notes (Signed)
Reviewed personally.  M. Suzanne Estephan Gallardo, MD.  

## 2014-04-05 LAB — URINE CULTURE

## 2014-04-08 LAB — IPS N GONORRHOEA AND CHLAMYDIA BY PCR

## 2014-04-08 NOTE — Addendum Note (Signed)
Addended by: Verner CholLEONARD, DEBORAH S on: 04/08/2014 01:26 PM   Modules accepted: Orders

## 2014-04-22 ENCOUNTER — Ambulatory Visit (INDEPENDENT_AMBULATORY_CARE_PROVIDER_SITE_OTHER): Payer: BC Managed Care – PPO | Admitting: *Deleted

## 2014-04-22 VITALS — BP 108/60 | Temp 98.3°F | Resp 16 | Ht 61.0 in | Wt 124.0 lb

## 2014-04-22 DIAGNOSIS — N390 Urinary tract infection, site not specified: Secondary | ICD-10-CM

## 2014-04-22 NOTE — Progress Notes (Signed)
Patient in today for TOC. Patient denies any Sx and states she completed Abx. Advised pt to call if Sx come back. - Pt agreed and states she has no concerns.

## 2014-04-23 LAB — URINALYSIS, MICROSCOPIC ONLY
BACTERIA UA: NONE SEEN
CRYSTALS: NONE SEEN
SQUAMOUS EPITHELIAL / LPF: NONE SEEN

## 2014-04-24 LAB — URINE CULTURE: Colony Count: >=100000

## 2014-05-04 ENCOUNTER — Telehealth: Payer: Self-pay

## 2014-05-04 NOTE — Telephone Encounter (Signed)
lmtcb

## 2014-05-04 NOTE — Telephone Encounter (Signed)
Message copied by Eliezer BottomJOHNSON, Crystallee Werden J on Mon May 04, 2014  4:31 PM ------      Message from: Verner CholLEONARD, DEBORAH S      Created: Wed Apr 29, 2014 10:45 AM       Urine micro negative with previous note ------

## 2014-05-05 NOTE — Telephone Encounter (Signed)
Pt is returning a call to Joy 

## 2014-05-05 NOTE — Telephone Encounter (Signed)
Patient notified of results as written by provider 

## 2014-09-01 ENCOUNTER — Ambulatory Visit (INDEPENDENT_AMBULATORY_CARE_PROVIDER_SITE_OTHER): Payer: BC Managed Care – PPO | Admitting: Certified Nurse Midwife

## 2014-09-01 ENCOUNTER — Encounter: Payer: Self-pay | Admitting: Certified Nurse Midwife

## 2014-09-01 VITALS — BP 110/60 | HR 64 | Temp 97.8°F | Resp 16 | Ht 61.0 in | Wt 119.0 lb

## 2014-09-01 DIAGNOSIS — N390 Urinary tract infection, site not specified: Secondary | ICD-10-CM

## 2014-09-01 DIAGNOSIS — Z113 Encounter for screening for infections with a predominantly sexual mode of transmission: Secondary | ICD-10-CM

## 2014-09-01 DIAGNOSIS — R319 Hematuria, unspecified: Secondary | ICD-10-CM

## 2014-09-01 LAB — POCT URINALYSIS DIPSTICK
Bilirubin, UA: NEGATIVE
Glucose, UA: NEGATIVE
Ketones, UA: NEGATIVE
NITRITE UA: NEGATIVE
PH UA: 5
PROTEIN UA: NEGATIVE
UROBILINOGEN UA: NEGATIVE

## 2014-09-01 MED ORDER — NITROFURANTOIN MONOHYD MACRO 100 MG PO CAPS: 100.0000 mg | ORAL_CAPSULE | Freq: Two times a day (BID) | ORAL | Status: DC

## 2014-09-01 NOTE — Patient Instructions (Signed)

## 2014-09-01 NOTE — Progress Notes (Signed)
Reviewed personally.  M. Suzanne Linzie Criss, MD.  

## 2014-09-01 NOTE — Progress Notes (Signed)
[age] y.o. single white female g0p0 here with complaint of UTI, with onset  on 2-3 days. Patient complaining of urinary frequency/urgency/ and pain with urination. Patient denies fever, chills, nausea or back pain. No new personal products. Patient feels not related to sexual activity. Denies any vaginal symptoms or new personal products. Contraception is Nexplanon. Would like STD screening.  O: Healthy female WDWN Affect: Normal, orientation x 3 Skin : warm and dry CVAT: negative bilateral Abdomen: positive for suprapubic tenderness  Pelvic exam: External genital area: normal, no lesions Bladder,Urethra, Urethral meatus: tender Vagina: normal vaginal discharge, normal appearance  Wet prep Cervix: normal, non tender Uterus:normal,non tender Adnexa: normal non tender, no fullness or masses  Poct urine-rbc tr, wbc 2+ A: UTI STD screening  P: Reviewed findings of UTI and need for treatment. ZO:XWRUEAVWRx:Macrobid see order UJW:JXBJYLab:Urine micro, culture Reviewed warning signs and symptoms of UTI and need to advise if occurs. Encouraged to limit soda, tea, and coffee RV 2 week if TOC needed for positive culture Labs: GC,Chlamydia, HIV,RPR Hep B Discussed prevention with monogamous relationship and condom use.  RV prn

## 2014-09-02 LAB — STD PANEL
HIV: NONREACTIVE
Hepatitis B Surface Ag: NEGATIVE

## 2014-09-02 LAB — URINALYSIS, MICROSCOPIC ONLY
BACTERIA UA: NONE SEEN
CASTS: NONE SEEN
CRYSTALS: NONE SEEN
SQUAMOUS EPITHELIAL / LPF: NONE SEEN

## 2014-09-03 LAB — URINE CULTURE

## 2014-09-03 LAB — IPS N GONORRHOEA AND CHLAMYDIA BY PCR

## 2014-09-04 ENCOUNTER — Other Ambulatory Visit: Payer: Self-pay | Admitting: Certified Nurse Midwife

## 2014-09-04 DIAGNOSIS — N390 Urinary tract infection, site not specified: Secondary | ICD-10-CM

## 2015-06-15 ENCOUNTER — Telehealth: Payer: Self-pay | Admitting: Certified Nurse Midwife

## 2015-06-15 NOTE — Telephone Encounter (Signed)
Patient is asking to come in for "testing". Patient is also interested in getting an IUD. Last seen 09/01/14.

## 2015-06-15 NOTE — Telephone Encounter (Signed)
Spoke with patient. Patient states that she would like to be seen for pap smear, std testing, and to discuss removal of her nexplanon and replacement. Advised will need to be seen for aex. Last aex was 04/21/2013. Denies any current problems or symptoms. States would like STD testing as a "precaution." Aex scheduled for 06/17/2015 at 10:30am with Verner Chol CNM. Patient is agreeable to date and time. Aware nexplanon will not be removed and replaced at this appointment. Advised will need to speak with Verner Chol CNM regarding scheduling of this appointment as her nexplanon is expired and will have to be replaced on her cycle. Patient is agreeable.  Routing to provider for final review. Patient agreeable to disposition. Will close encounter.   Patient aware provider will review message and nurse will return call if any additional advice or change of disposition.

## 2015-06-17 ENCOUNTER — Ambulatory Visit (INDEPENDENT_AMBULATORY_CARE_PROVIDER_SITE_OTHER): Payer: BLUE CROSS/BLUE SHIELD | Admitting: Certified Nurse Midwife

## 2015-06-17 ENCOUNTER — Encounter: Payer: Self-pay | Admitting: Certified Nurse Midwife

## 2015-06-17 VITALS — BP 120/80 | HR 72 | Resp 20 | Ht 61.25 in | Wt 139.0 lb

## 2015-06-17 DIAGNOSIS — Z01419 Encounter for gynecological examination (general) (routine) without abnormal findings: Secondary | ICD-10-CM

## 2015-06-17 DIAGNOSIS — Z308 Encounter for other contraceptive management: Secondary | ICD-10-CM | POA: Diagnosis not present

## 2015-06-17 DIAGNOSIS — Z0000 Encounter for general adult medical examination without abnormal findings: Secondary | ICD-10-CM | POA: Diagnosis not present

## 2015-06-17 DIAGNOSIS — Z124 Encounter for screening for malignant neoplasm of cervix: Secondary | ICD-10-CM | POA: Diagnosis not present

## 2015-06-17 LAB — POCT URINALYSIS DIPSTICK
Bilirubin, UA: NEGATIVE
Blood, UA: NEGATIVE
GLUCOSE UA: NEGATIVE
KETONES UA: NEGATIVE
LEUKOCYTES UA: NEGATIVE
Nitrite, UA: NEGATIVE
PROTEIN UA: NEGATIVE
Urobilinogen, UA: NEGATIVE
pH, UA: 5

## 2015-06-17 LAB — HEMOGLOBIN, FINGERSTICK: Hemoglobin, fingerstick: 13 g/dL (ref 12.0–16.0)

## 2015-06-17 NOTE — Progress Notes (Signed)
24 y.o. G0P0000 Single  Caucasian Fe here for annual exam. Periods normal with Nexplanon. Due for removal in 10/16 and would like to have re-insertion. Request STD screening,no concerns. Busy with school. Sees PCP prn. No health issues today.  Patient's last menstrual period was 06/16/2015.          Sexually active: Yes.    The current method of family planning is implanon.    Exercising: No.  exercise Smoker:  no  Health Maintenance: Pap:  none MMG:  none Colonoscopy: none BMD:   none TDaP:  2013 Labs: Poct urine-neg, Hgb-13.0 Self breast exam: not done   reports that she has been smoking.  She has never used smokeless tobacco. She reports that she drinks about 0.6 - 1.2 oz of alcohol per week. She reports that she uses illicit drugs (Marijuana).  Past Medical History  Diagnosis Date  . Hiatal hernia   . Reflux   . Anxiety   . Depression   . Fx metatarsal 11/02/2011    right 5th toe  . IBS (irritable bowel syndrome)   . STD (sexually transmitted disease) 06/2011    + chlamydia    Past Surgical History  Procedure Laterality Date  . Wisdom tooth extraction    . Orif toe fracture  01/18/2012    Procedure: OPEN REDUCTION INTERNAL FIXATION (ORIF) METATARSAL (TOE) FRACTURE;  Surgeon: Loreta Ave, MD;  Location: [facility];  Service: Orthopedics;  Laterality: Right;  right 5th toe  . Implannon      put in around 10/13    Current Outpatient Prescriptions  Medication Sig Dispense Refill  . clonazePAM (KLONOPIN) 1 MG tablet Take 1 mg by mouth.    . DULoxetine (CYMBALTA) 30 MG capsule Take 30 mg by mouth daily.    Marland Kitchen etonogestrel (IMPLANON) 68 MG IMPL implant Inject 1 each into the skin once.    . Vilazodone HCl (VIIBRYD) 20 MG TABS Take by mouth daily.     No current facility-administered medications for this visit.    Family History  Problem Relation Age of Onset  . Cancer Father     lypmh nodes,tonsils & tongue    ROS:  Pertinent items are noted in  HPI.  Otherwise, a comprehensive ROS was negative.  Exam:   BP 120/80 mmHg  Pulse 72  Resp 20  Ht 5' 1.25" (1.556 m)  Wt 139 lb (63.05 kg)  BMI 26.04 kg/m2  LMP 06/16/2015 Height: 5' 1.25" (155.6 cm) Ht Readings from Last 3 Encounters:  06/17/15 5' 1.25" (1.556 m)  09/01/14 5\' 1"  (1.549 m)  04/22/14 5\' 1"  (1.549 m)    General appearance: alert, cooperative and appears stated age Head: Normocephalic, without obvious abnormality, atraumatic Neck: no adenopathy, supple, symmetrical, trachea midline and thyroid normal to inspection and palpation Lungs: clear to auscultation bilaterally Breasts: normal appearance, no masses or tenderness, No nipple retraction or dimpling, No nipple discharge or bleeding, No axillary or supraclavicular adenopathy Heart: regular rate and rhythm Abdomen: soft, non-tender; no masses,  no organomegaly Extremities: extremities normal, atraumatic, no cyanosis or edema, Nexplanon palpated intact in right arm Skin: Skin color, texture, turgor normal. No rashes or lesions, piercing and tattoos noted Lymph nodes: Cervical, supraclavicular, and axillary nodes normal. No abnormal inguinal nodes palpated Neurologic: Grossly normal   Pelvic: External genitalia:  no lesions              Urethra:  normal appearing urethra with no masses, tenderness or lesions  Bartholin's and Skene's: normal                 Vagina: normal appearing vagina with normal color and discharge, no lesions              Cervix: normal              Pap taken: Yes.   Bimanual Exam:  Uterus:  normal size, contour, position, consistency, mobility, non-tender              Adnexa: normal adnexa and no mass, fullness, tenderness               Rectovaginal: Confirms               Anus:  normal appearance  Chaperone present: yes  A:  Well Woman with normal exam  Contraception Nexplanon due for removal 10/16 and desires re insertion  STD screening  P:   Reviewed health and wellness  pertinent to exam   Patient aware she will be called and scheduled.  Lab HIV,RPR,GC,Chlamydia, Affirm  Pap smear as above with HPV reflex   counseled on breast self exam, STD prevention, HIV risk factors and prevention, adequate intake of calcium and vitamin D, diet and exercise  return annually or prn  An After Visit Summary was printed and given to the patient.

## 2015-06-17 NOTE — Patient Instructions (Signed)
General topics  Next pap or exam is  due in 1 year Take a Women's multivitamin Take 1200 mg. of calcium daily - prefer dietary If any concerns in interim to call back  Breast Self-Awareness Practicing breast self-awareness may pick up problems early, prevent significant medical complications, and possibly save your life. By practicing breast self-awareness, you can become familiar with how your breasts look and feel and if your breasts are changing. This allows you to notice changes early. It can also offer you some reassurance that your breast health is good. One way to learn what is normal for your breasts and whether your breasts are changing is to do a breast self-exam. If you find a lump or something that was not present in the past, it is best to contact your caregiver right away. Other findings that should be evaluated by your caregiver include nipple discharge, especially if it is bloody; skin changes or reddening; areas where the skin seems to be pulled in (retracted); or new lumps and bumps. Breast pain is seldom associated with cancer (malignancy), but should also be evaluated by a caregiver. BREAST SELF-EXAM The best time to examine your breasts is 5 7 days after your menstrual period is over.  ExitCare Patient Information 2013 [employer].   Exercise to Stay Healthy Exercise helps you become and stay healthy. EXERCISE IDEAS AND TIPS Choose exercises that:  You enjoy.  Fit into your day. You do not need to exercise really hard to be healthy. You can do exercises at a slow or medium level and stay healthy. You can:  Stretch before and after working out.  Try yoga, Pilates, or tai chi.  Lift weights.  Walk fast, swim, jog, run, climb stairs, bicycle, dance, or rollerskate.  Take aerobic classes. Exercises that burn about 150 calories:  Running 1  miles in 15 minutes.  Playing volleyball for 45 to 60 minutes.  Washing and waxing a car for 45 to 60  minutes.  Playing touch football for 45 minutes.  Walking 1  miles in 35 minutes.  Pushing a stroller 1  miles in 30 minutes.  Playing basketball for 30 minutes.  Raking leaves for 30 minutes.  Bicycling 5 miles in 30 minutes.  Walking 2 miles in 30 minutes.  Dancing for 30 minutes.  Shoveling snow for 15 minutes.  Swimming laps for 20 minutes.  Walking up stairs for 15 minutes.  Bicycling 4 miles in 15 minutes.  Gardening for 30 to 45 minutes.  Jumping rope for 15 minutes.  Washing windows or floors for 45 to 60 minutes. Document Released: 11/11/2010 Document Revised: 01/01/2012 Document Reviewed: 11/11/2010 ExitCare Patient Information 2013 [employer].   Other topics ( that may be useful information):    Sexually Transmitted Disease Sexually transmitted disease (STD) refers to any infection that is passed from person to person during sexual activity. This may happen by way of saliva, semen, blood, vaginal mucus, or urine. Common STDs include:  Gonorrhea.  Chlamydia.  Syphilis.  HIV/AIDS.  Genital herpes.  Hepatitis B and C.  Trichomonas.  Human papillomavirus (HPV).  Pubic lice. CAUSES  An STD may be spread by bacteria, virus, or parasite. A person can get an STD by:  Sexual intercourse with an infected person.  Sharing sex toys with an infected person.  Sharing needles with an infected person.  Having intimate contact with the genitals, mouth, or rectal areas of an infected person. SYMPTOMS  Some people may not have any symptoms, but   they can still pass the infection to others. Different STDs have different symptoms. Symptoms include:  Painful or bloody urination.  Pain in the pelvis, abdomen, vagina, anus, throat, or eyes.  Skin rash, itching, irritation, growths, or sores (lesions). These usually occur in the genital or anal area.  Abnormal vaginal discharge.  Penile discharge in men.  Soft, flesh-colored skin growths in the  genital or anal area.  Fever.  Pain or bleeding during sexual intercourse.  Swollen glands in the groin area.  Yellow skin and eyes (jaundice). This is seen with hepatitis. DIAGNOSIS  To make a diagnosis, your caregiver may:  Take a medical history.  Perform a physical exam.  Take a specimen (culture) to be examined.  Examine a sample of discharge under a microscope.  Perform blood test TREATMENT   Chlamydia, gonorrhea, trichomonas, and syphilis can be cured with antibiotic medicine.  Genital herpes, hepatitis, and HIV can be treated, but not cured, with prescribed medicines. The medicines will lessen the symptoms.  Genital warts from HPV can be treated with medicine or by freezing, burning (electrocautery), or surgery. Warts may come back.  HPV is a virus and cannot be cured with medicine or surgery.However, abnormal areas may be followed very closely by your caregiver and may be removed from the cervix, vagina, or vulva through office procedures or surgery. If your diagnosis is confirmed, your recent sexual partners need treatment. This is true even if they are symptom-free or have a negative culture or evaluation. They should not have sex until their caregiver says it is okay. HOME CARE INSTRUCTIONS  All sexual partners should be informed, tested, and treated for all STDs.  Take your antibiotics as directed. Finish them even if you start to feel better.  Only take over-the-counter or prescription medicines for pain, discomfort, or fever as directed by your caregiver.  Rest.  Eat a balanced diet and drink enough fluids to keep your urine clear or pale yellow.  Do not have sex until treatment is completed and you have followed up with your caregiver. STDs should be checked after treatment.  Keep all follow-up appointments, Pap tests, and blood tests as directed by your caregiver.  Only use latex condoms and water-soluble lubricants during sexual activity. Do not use  petroleum jelly or oils.  Avoid alcohol and illegal drugs.  Get vaccinated for HPV and hepatitis. If you have not received these vaccines in the past, talk to your caregiver about whether one or both might be right for you.  Avoid risky sex practices that can break the skin. The only way to avoid getting an STD is to avoid all sexual activity.Latex condoms and dental dams (for oral sex) will help lessen the risk of getting an STD, but will not completely eliminate the risk. SEEK MEDICAL CARE IF:   You have a fever.  You have any new or worsening symptoms. Document Released: 12/30/2002 Document Revised: 01/01/2012 Document Reviewed: 01/06/2011 [facility] Patient Information 2013 [employer].    Domestic Abuse You are being battered or abused if someone close to you hits, pushes, or physically hurts you in any way. You also are being abused if you are forced into activities. You are being sexually abused if you are forced to have sexual contact of any kind. You are being emotionally abused if you are made to feel worthless or if you are constantly threatened. It is important to remember that help is available. No one has the right to abuse you. PREVENTION OF FURTHER  ABUSE  Learn the warning signs of danger. This varies with situations but may include: the use of alcohol, threats, isolation from friends and family, or forced sexual contact. Leave if you feel that violence is going to occur.  If you are attacked or beaten, report it to the police so the abuse is documented. You do not have to press charges. The police can protect you while you or the attackers are leaving. Get the officer's name and badge number and a copy of the report.  Find someone you can trust and tell them what is happening to you: your caregiver, a nurse, clergy member, close friend or family member. Feeling ashamed is natural, but remember that you have done nothing wrong. No one deserves abuse. Document Released:  10/06/2000 Document Revised: 01/01/2012 Document Reviewed: 12/15/2010 ExitCare Patient Information 2013 [employer].    How Much is Too Much Alcohol? Drinking too much alcohol can cause injury, accidents, and health problems. These types of problems can include:   Car crashes.  Falls.  Family fighting (domestic violence).  Drowning.  Fights.  Injuries.  Burns.  Damage to certain organs.  Having a baby with birth defects. ONE DRINK CAN BE TOO MUCH WHEN YOU ARE:  Working.  Pregnant or breastfeeding.  Taking medicines. Ask your doctor.  Driving or planning to drive. If you or someone you know has a drinking problem, get help from a doctor.  Document Released: 08/05/2009 Document Revised: 01/01/2012 Document Reviewed: 08/05/2009 ExitCare Patient Information 2013 [employer].   Smoking Hazards Smoking cigarettes is extremely bad for your health. Tobacco smoke has over 200 known poisons in it. There are over 60 chemicals in tobacco smoke that cause cancer. Some of the chemicals found in cigarette smoke include:   Cyanide.  Benzene.  Formaldehyde.  Methanol (wood alcohol).  Acetylene (fuel used in welding torches).  Ammonia. Cigarette smoke also contains the poisonous gases nitrogen oxide and carbon monoxide.  Cigarette smokers have an increased risk of many serious medical problems and Smoking causes approximately:  90% of all lung cancer deaths in men.  80% of all lung cancer deaths in women.  90% of deaths from chronic obstructive lung disease. Compared with nonsmokers, smoking increases the risk of:  Coronary heart disease by 2 to 4 times.  Stroke by 2 to 4 times.  Men developing lung cancer by 23 times.  Women developing lung cancer by 13 times.  Dying from chronic obstructive lung diseases by 12 times.  . Smoking is the most preventable cause of death and disease in our society.  WHY IS SMOKING ADDICTIVE?  Nicotine is the chemical  agent in tobacco that is capable of causing addiction or dependence.  When you smoke and inhale, nicotine is absorbed rapidly into the bloodstream through your lungs. Nicotine absorbed through the lungs is capable of creating a powerful addiction. Both inhaled and non-inhaled nicotine may be addictive.  Addiction studies of cigarettes and spit tobacco show that addiction to nicotine occurs mainly during the teen years, when young people begin using tobacco products. WHAT ARE THE BENEFITS OF QUITTING?  There are many health benefits to quitting smoking.   Likelihood of developing cancer and heart disease decreases. Health improvements are seen almost immediately.  Blood pressure, pulse rate, and breathing patterns start returning to normal soon after quitting. QUITTING SMOKING   American Lung Association - 1-800-LUNGUSA  American Cancer Society - 1-800-ACS-2345 Document Released: 11/16/2004 Document Revised: 01/01/2012 Document Reviewed: 07/21/2009 ExitCare Patient Information 2013 ExitCare,   LLC.   Stress Management Stress is a state of physical or mental tension that often results from changes in your life or normal routine. Some common causes of stress are:  Death of a loved one.  Injuries or severe illnesses.  Getting fired or changing jobs.  Moving into a new home. Other causes may be:  Sexual problems.  Business or financial losses.  Taking on a large debt.  Regular conflict with someone at home or at work.  Constant tiredness from lack of sleep. It is not just bad things that are stressful. It may be stressful to:  Win the lottery.  Get married.  Buy a new car. The amount of stress that can be easily tolerated varies from person to person. Changes generally cause stress, regardless of the types of change. Too much stress can affect your health. It may lead to physical or emotional problems. Too little stress (boredom) may also become stressful. SUGGESTIONS TO  REDUCE STRESS:  Talk things over with your family and friends. It often is helpful to share your concerns and worries. If you feel your problem is serious, you may want to get help from a professional counselor.  Consider your problems one at a time instead of lumping them all together. Trying to take care of everything at once may seem impossible. List all the things you need to do and then start with the most important one. Set a goal to accomplish 2 or 3 things each day. If you expect to do too many in a single day you will naturally fail, causing you to feel even more stressed.  Do not use alcohol or drugs to relieve stress. Although you may feel better for a short time, they do not remove the problems that caused the stress. They can also be habit forming.  Exercise regularly - at least 3 times per week. Physical exercise can help to relieve that "uptight" feeling and will relax you.  The shortest distance between despair and hope is often a good night's sleep.  Go to bed and get up on time allowing yourself time for appointments without being rushed.  Take a short "time-out" period from any stressful situation that occurs during the day. Close your eyes and take some deep breaths. Starting with the muscles in your face, tense them, hold it for a few seconds, then relax. Repeat this with the muscles in your neck, shoulders, hand, stomach, back and legs.  Take good care of yourself. Eat a balanced diet and get plenty of rest.  Schedule time for having fun. Take a break from your daily routine to relax. HOME CARE INSTRUCTIONS   Call if you feel overwhelmed by your problems and feel you can no longer manage them on your own.  Return immediately if you feel like hurting yourself or someone else. Document Released: 04/04/2001 Document Revised: 01/01/2012 Document Reviewed: 11/25/2007 ExitCare Patient Information 2013 ExitCare[employer].   

## 2015-06-18 ENCOUNTER — Other Ambulatory Visit: Payer: Self-pay | Admitting: Certified Nurse Midwife

## 2015-06-18 ENCOUNTER — Telehealth: Payer: Self-pay

## 2015-06-18 DIAGNOSIS — B9689 Other specified bacterial agents as the cause of diseases classified elsewhere: Secondary | ICD-10-CM

## 2015-06-18 DIAGNOSIS — N760 Acute vaginitis: Principal | ICD-10-CM

## 2015-06-18 LAB — HIV ANTIBODY (ROUTINE TESTING W REFLEX): HIV 1&2 Ab, 4th Generation: NONREACTIVE

## 2015-06-18 LAB — WET PREP BY MOLECULAR PROBE
Candida species: NEGATIVE
Gardnerella vaginalis: POSITIVE — AB
Trichomonas vaginosis: NEGATIVE

## 2015-06-18 LAB — RPR

## 2015-06-18 MED ORDER — HYLAFEM VA SUPP: 1.0000 | Freq: Every day | VAGINAL | Status: DC

## 2015-06-18 NOTE — Telephone Encounter (Signed)
lmtcb

## 2015-06-18 NOTE — Progress Notes (Signed)
Reviewed personally.  M. Suzanne Ashlee Bewley, MD.  

## 2015-06-18 NOTE — Telephone Encounter (Signed)
Patient notified of results. See lab 

## 2015-06-18 NOTE — Telephone Encounter (Signed)
-----   Message from Verner Chol, CNM sent at 06/18/2015  8:05 AM EDT ----- Notify patient that affirm showed positive for BV  Rx Hylafem in treat for 3 nights and repeat one week later for 3 nights this should clear this if symptoms advise Negative for yeast and trichomonas

## 2015-06-21 LAB — IPS PAP TEST WITH REFLEX TO HPV

## 2015-06-21 LAB — IPS N GONORRHOEA AND CHLAMYDIA BY PCR

## 2015-06-22 ENCOUNTER — Telehealth: Payer: Self-pay | Admitting: Certified Nurse Midwife

## 2015-06-22 NOTE — Telephone Encounter (Signed)
Call to patient to review benefits for Nexplanon removal and re-insertion. Patient agreeable. Scheduled for 07/02/15 at 2:00 with Debbi Darcel Bayley.   Routing to provider for final review. Patient agreeable to disposition. Will close encounter.

## 2015-07-02 ENCOUNTER — Ambulatory Visit (INDEPENDENT_AMBULATORY_CARE_PROVIDER_SITE_OTHER): Payer: BLUE CROSS/BLUE SHIELD | Admitting: Certified Nurse Midwife

## 2015-07-02 ENCOUNTER — Encounter: Payer: Self-pay | Admitting: Certified Nurse Midwife

## 2015-07-02 VITALS — BP 104/72 | HR 70 | Resp 16 | Ht 61.25 in | Wt 138.0 lb

## 2015-07-02 DIAGNOSIS — Z308 Encounter for other contraceptive management: Secondary | ICD-10-CM | POA: Diagnosis not present

## 2015-07-02 DIAGNOSIS — Z3046 Encounter for surveillance of implantable subdermal contraceptive: Secondary | ICD-10-CM

## 2015-07-02 DIAGNOSIS — Z30017 Encounter for initial prescription of implantable subdermal contraceptive: Secondary | ICD-10-CM

## 2015-07-02 HISTORY — PX: OTHER SURGICAL HISTORY: SHX169

## 2015-07-02 NOTE — Progress Notes (Signed)
  23 yrs Caucasian Single female [id number]  presents for Nexplanon removal/ and reinsertion of Nexplanon.   LMP8/24/16         Contraception Reinsertion of Nexplanon planned. Questions addressed.  Consent signed. Requests same as above.  HPI neg. Healthy WDWN female Affect normal, orientation x 3  Procedure: Patient placed supine on exam table with herright arm   flexed at the elbow and externally rotated.The insertion site was identified on the inner side of upper arm. Very close to the elbow. The device was palpated. Incision site for removal was marked. The removal site was cleansed with betadine solution and 1 ml of 1% Lidocaine was injected at the incision site. A small 2 mm incision was made at the distal end of the implant.The Nexaplon implant was grasped with curved forceps and removed gently intact.The implant was shown to the patient and discarded. The incision was closed with a steri strip.  No active bleeding was noted. A small adhesive bandage placed over the removal site.  A pressure bandage was place over the site.  Assessment: Nexplanon Removal Pt tolerated procedure well. Planned contraception:The current method of family planning is reinsertion of Nexplanon. Discussed with patient the insertion site where Nexplanon was removed was too close to elbow and concerns for reinsertion at this site. Would recommend insertion in non dominant arm with in appropriate position. Patient agreeable..  Plan :Instructions and warning signs and symptoms given.  Remove bandage in 3 days. Questions addressed Will proceed with insertion in left arm.   Procedure: Patient placed supine on exam table with her  left arm   flexed at the elbow and externally rotated.The insertion site was identified on the inner side of upper arm.  Two marks were made 8-10cm above the medial epicondyle of the humerus.  The first mark for insertion and the second mark 4cm from the first. The insertion sited was cleansed with  betadine solution. 2 ml of 1% Lidocaine was injected along the track of the insertion site. The Nexplanon under sterile conditions was inserted in left arm. The implant was palpated in the arm after insertion. A small adhesive bandage placed over insertion site. The patient palpated the device for monitoring of the device. No active bleeding was noted.  A pressure bandage was place over the site.  Assessment:Nexplanon Insertion Pt tolerated procedure well.  Plan:Instructions and warning signs and symptoms given  Questions addressed  Return visit1  Month,prn

## 2015-07-04 NOTE — Progress Notes (Signed)
Reviewed personally.  M. Suzanne Cailah Reach, MD.  

## 2016-02-15 ENCOUNTER — Encounter: Payer: Self-pay | Admitting: Certified Nurse Midwife

## 2016-02-15 ENCOUNTER — Ambulatory Visit (INDEPENDENT_AMBULATORY_CARE_PROVIDER_SITE_OTHER): Payer: BLUE CROSS/BLUE SHIELD | Admitting: Certified Nurse Midwife

## 2016-02-15 VITALS — BP 120/64 | HR 70 | Resp 16 | Ht 61.25 in | Wt 136.0 lb

## 2016-02-15 DIAGNOSIS — Z113 Encounter for screening for infections with a predominantly sexual mode of transmission: Secondary | ICD-10-CM

## 2016-02-15 NOTE — Progress Notes (Signed)
25 y.o. Single Caucasian female [id number] here for STD screening. Patient has had partner change and would like to make sure no concerns. Denies any vaginal discharge changes or urinary symptoms. Contraception Nexplanon, working well. 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 No other health concerns today.  O: Healthy WD,WN female Affect: normal, orientation x 3 Skin:warm and dry Abdomen:soft, non tender,   Inguinal lymph nodes not enlarged or tender Pelvic exam:EXTERNAL GENITALIA: normal appearing vulva with no masses, tenderness or lesions VAGINA: no abnormal discharge or lesions and affirm taken CERVIX: no lesions or cervical motion tenderness and normal appearance UTERUS: anteverted and normal , non tender, no masses ADNEXA: no masses palpable, nontender and normal adnexa bilateral RECTUM: exam not indicated  A: STD screening desired, no concerns or symptoms Normal pelvic exam Contraception Nexplanon    P: Discussed findings of normal pelvic exam. Discussed importance of condom use for protection from STD exposure. Lab: HIV,RPR, Hep. C, GC,Chlamydia, Affirm Warning signs and symptoms with Nexplanon reviewed.  Rv prn, aex   Labs   Instructions given regarding:  RV

## 2016-02-15 NOTE — Patient Instructions (Signed)
Sexually Transmitted Disease °A sexually transmitted disease (STD) is a disease or infection that may be passed (transmitted) from person to person, usually during sexual activity. This may happen by way of saliva, semen, blood, vaginal mucus, or urine. Common STDs include: °· Gonorrhea. °· Chlamydia. °· Syphilis. °· HIV and AIDS. °· Genital herpes. °· Hepatitis B and C. °· Trichomonas. °· Human papillomavirus (HPV). °· Pubic lice. °· Scabies. °· Mites. °· Bacterial vaginosis. °WHAT ARE CAUSES OF STDs? °An STD may be caused by bacteria, a virus, or parasites. STDs are often transmitted during sexual activity if one person is infected. However, they may also be transmitted through nonsexual means. STDs may be transmitted after:  °· Sexual intercourse with an infected person. °· Sharing sex toys with an infected person. °· Sharing needles with an infected person or using unclean piercing or tattoo needles. °· Having intimate contact with the genitals, mouth, or rectal areas of an infected person. °· Exposure to infected fluids during birth. °WHAT ARE THE SIGNS AND SYMPTOMS OF STDs? °Different STDs have different symptoms. Some people may not have any symptoms. If symptoms are present, they may include: °· Painful or bloody urination. °· Pain in the pelvis, abdomen, vagina, anus, throat, or eyes. °· A skin rash, itching, or irritation. °· Growths, ulcerations, blisters, or sores in the genital and anal areas. °· Abnormal vaginal discharge with or without bad odor. °· Penile discharge in men. °· Fever. °· Pain or bleeding during sexual intercourse. °· Swollen glands in the groin area. °· Yellow skin and eyes (jaundice). This is seen with hepatitis. °· Swollen testicles. °· Infertility. °· Sores and blisters in the mouth. °HOW ARE STDs DIAGNOSED? °To make a diagnosis, your health care provider may: °· Take a medical history. °· Perform a physical exam. °· Take a sample of any discharge to examine. °· Swab the throat,  cervix, opening to the penis, rectum, or vagina for testing. °· Test a sample of your first morning urine. °· Perform blood tests. °· Perform a Pap test, if this applies. °· Perform a colposcopy. °· Perform a laparoscopy. °HOW ARE STDs TREATED? °Treatment depends on the STD. Some STDs may be treated but not cured. °· Chlamydia, gonorrhea, trichomonas, and syphilis can be cured with antibiotic medicine. °· Genital herpes, hepatitis, and HIV can be treated, but not cured, with prescribed medicines. The medicines lessen symptoms. °· Genital warts from HPV can be treated with medicine or by freezing, burning (electrocautery), or surgery. Warts may come back. °· HPV cannot be cured with medicine or surgery. However, abnormal areas may be removed from the cervix, vagina, or vulva. °· If your diagnosis is confirmed, your recent sexual partners need treatment. This is true even if they are symptom-free or have a negative culture or evaluation. They should not have sex until their health care providers say it is okay. °· Your health care provider may test you for infection again 3 months after treatment. °HOW CAN I REDUCE MY RISK OF GETTING AN STD? °Take these steps to reduce your risk of getting an STD: °· Use latex condoms, dental dams, and water-soluble lubricants during sexual activity. Do not use petroleum jelly or oils. °· Avoid having multiple sex partners. °· Do not have sex with someone who has other sex partners °· Do not have sex with anyone you do not know or who is at high risk for an STD. °· Avoid risky sex practices that can break your skin. °· Do not have sex   if you have open sores on your mouth or skin. °· Avoid drinking too much alcohol or taking illegal drugs. Alcohol and drugs can affect your judgment and put you in a vulnerable position. °· Avoid engaging in oral and anal sex acts. °· Get vaccinated for HPV and hepatitis. If you have not received these vaccines in the past, talk to your health care  provider about whether one or both might be right for you. °· If you are at risk of being infected with HIV, it is recommended that you take a prescription medicine daily to prevent HIV infection. This is called pre-exposure prophylaxis (PrEP). You are considered at risk if: °¨ You are a man who has sex with other men (MSM). °¨ You are a heterosexual man or woman and are sexually active with more than one partner. °¨ You take drugs by injection. °¨ You are sexually active with a partner who has HIV. °· Talk with your health care provider about whether you are at high risk of being infected with HIV. If you choose to begin PrEP, you should first be tested for HIV. You should then be tested every 3 months for as long as you are taking PrEP. °WHAT SHOULD I DO IF I THINK I HAVE AN STD? °· See your health care provider. °· Tell your sexual partner(s). They should be tested and treated for any STDs. °· Do not have sex until your health care provider says it is okay. °WHEN SHOULD I GET IMMEDIATE MEDICAL CARE? °Contact your health care provider right away if:  °· You have severe abdominal pain. °· You are a man and notice swelling or pain in your testicles. °· You are a woman and notice swelling or pain in your vagina. °  °This information is not intended to replace advice given to you by your health care provider. Make sure you discuss any questions you have with your health care provider. °  °Document Released: 12/30/2002 Document Revised: 10/30/2014 Document Reviewed: 04/29/2013 °Elsevier Interactive Patient Education ©2016 [employer] ° °

## 2016-02-16 ENCOUNTER — Other Ambulatory Visit: Payer: Self-pay

## 2016-02-16 DIAGNOSIS — N760 Acute vaginitis: Principal | ICD-10-CM

## 2016-02-16 DIAGNOSIS — B9689 Other specified bacterial agents as the cause of diseases classified elsewhere: Secondary | ICD-10-CM

## 2016-02-16 LAB — RPR

## 2016-02-16 LAB — WET PREP BY MOLECULAR PROBE
Candida species: NEGATIVE
Gardnerella vaginalis: POSITIVE — AB
Trichomonas vaginosis: NEGATIVE

## 2016-02-16 LAB — HEPATITIS C ANTIBODY: HCV AB: NEGATIVE

## 2016-02-16 LAB — HIV ANTIBODY (ROUTINE TESTING W REFLEX): HIV: NONREACTIVE

## 2016-02-16 MED ORDER — METRONIDAZOLE 0.75 % VA GEL: 1.0000 | Freq: Two times a day (BID) | VAGINAL | Status: DC

## 2016-02-17 NOTE — Progress Notes (Signed)
Encounter reviewed Jill Jertson, MD   

## 2016-02-18 LAB — IPS N GONORRHOEA AND CHLAMYDIA BY PCR

## 2016-07-27 ENCOUNTER — Ambulatory Visit (INDEPENDENT_AMBULATORY_CARE_PROVIDER_SITE_OTHER): Payer: BLUE CROSS/BLUE SHIELD | Admitting: Certified Nurse Midwife

## 2016-07-27 ENCOUNTER — Ambulatory Visit: Payer: BLUE CROSS/BLUE SHIELD | Admitting: Certified Nurse Midwife

## 2016-07-27 ENCOUNTER — Encounter: Payer: Self-pay | Admitting: Certified Nurse Midwife

## 2016-07-27 VITALS — BP 110/64 | HR 68 | Temp 97.6°F | Resp 16 | Ht 61.25 in | Wt 136.0 lb

## 2016-07-27 DIAGNOSIS — R319 Hematuria, unspecified: Secondary | ICD-10-CM

## 2016-07-27 DIAGNOSIS — Z113 Encounter for screening for infections with a predominantly sexual mode of transmission: Secondary | ICD-10-CM | POA: Diagnosis not present

## 2016-07-27 DIAGNOSIS — N390 Urinary tract infection, site not specified: Secondary | ICD-10-CM | POA: Diagnosis not present

## 2016-07-27 LAB — POCT URINALYSIS DIPSTICK
Bilirubin, UA: NEGATIVE
Glucose, UA: NEGATIVE
KETONES UA: NEGATIVE
Nitrite, UA: NEGATIVE
PH UA: 5
PROTEIN UA: NEGATIVE
Urobilinogen, UA: NEGATIVE

## 2016-07-27 MED ORDER — NITROFURANTOIN MONOHYD MACRO 100 MG PO CAPS: 100.0000 mg | ORAL_CAPSULE | Freq: Two times a day (BID) | ORAL | 0 refills | Status: DC

## 2016-07-27 MED ORDER — PHENAZOPYRIDINE HCL 200 MG PO TABS: 200.0000 mg | ORAL_TABLET | Freq: Three times a day (TID) | ORAL | 0 refills | Status: DC | PRN

## 2016-07-27 NOTE — Patient Instructions (Signed)

## 2016-07-27 NOTE — Progress Notes (Signed)
25 y.o. Single Caucasian female G0P0000 here with complaint of UTI, with onset  on 2-3 days ago. Patient complaining of urinary frequency/urgency/ and pain with urination. Patient denies fever, chills, nausea or back pain. No new personal products. Patient feels possibly related to sexual activity. Denies any vaginal symptoms, but new partner and desires STD screening.    Contraception is Nexplanon, working well.. Patient not consuming adequate water intake. Drinking some soda. No other health issues today.   O: Healthy female WDWN Affect: Normal, orientation x 3 Skin : warm and dry CVAT: negative bilateral Abdomen: positive for suprapubic tenderness POCT: urine +WBC +RBC  Pelvic exam: External genital area: normal, no lesions Bladder,Urethra tender, Urethral meatus: tender, red Vagina: normal vaginal discharge, normal appearance  Specimens collected Cervix: normal, non tender Uterus:normal,non tender Adnexa: normal non tender, no fullness or masses   A: UTI ? Post coital Normal pelvic exam STD screening Contraception Nexplanon  P: Reviewed findings of UTI and need for treatment. ZO:[id number]:Macrobid see order with instructions Rx Pyridium see order UJW:[id number]:Urine micro, culture Reviewed warning signs and symptoms of UTI and need to advise if occurring. Encouraged to limit soda, tea, and coffee and be sure to increase water intake. STD prevention discussed with condom use.   RV prn

## 2016-07-28 ENCOUNTER — Other Ambulatory Visit: Payer: Self-pay | Admitting: Certified Nurse Midwife

## 2016-07-28 ENCOUNTER — Telehealth: Payer: Self-pay

## 2016-07-28 DIAGNOSIS — N760 Acute vaginitis: Principal | ICD-10-CM

## 2016-07-28 DIAGNOSIS — B9689 Other specified bacterial agents as the cause of diseases classified elsewhere: Secondary | ICD-10-CM

## 2016-07-28 LAB — URINALYSIS, MICROSCOPIC ONLY
CASTS: NONE SEEN [LPF]
Crystals: NONE SEEN [HPF]
RBC / HPF: NONE SEEN RBC/HPF (ref <=2)
YEAST: NONE SEEN [HPF]

## 2016-07-28 LAB — RPR

## 2016-07-28 LAB — WET PREP BY MOLECULAR PROBE
Candida species: NEGATIVE
GARDNERELLA VAGINALIS: POSITIVE — AB
TRICHOMONAS VAG: NEGATIVE

## 2016-07-28 LAB — IPS N GONORRHOEA AND CHLAMYDIA BY PCR

## 2016-07-28 LAB — HIV ANTIBODY (ROUTINE TESTING W REFLEX): HIV 1&2 Ab, 4th Generation: NONREACTIVE

## 2016-07-28 MED ORDER — METRONIDAZOLE 0.75 % VA GEL: 1.0000 | Freq: Two times a day (BID) | VAGINAL | 0 refills | Status: DC

## 2016-07-28 NOTE — Telephone Encounter (Signed)
-----   Message from Verner Choleborah S Leonard, CNM sent at 07/28/2016  7:43 AM EDT ----- Notify patient that her HIV,RPR are negative Urine micro positive for bacteria continue medication for UTI Wet prep was negative for yeast and trichomonas, positive for BV  Metrogel order placed please give instructions  Other labs pending

## 2016-07-28 NOTE — Telephone Encounter (Signed)
Patient notified of results. See lab 

## 2016-07-28 NOTE — Telephone Encounter (Signed)
lmtcb

## 2016-07-29 LAB — URINE CULTURE

## 2016-07-30 NOTE — Progress Notes (Signed)
Encounter reviewed Jane Nesler, MD   

## 2016-08-01 ENCOUNTER — Telehealth: Payer: Self-pay

## 2016-08-01 NOTE — Telephone Encounter (Signed)
lmtcb

## 2016-08-01 NOTE — Telephone Encounter (Signed)
-----   Message from Verner Choleborah S Leonard, CNM sent at 08/01/2016 12:30 PM EDT ----- Notify patient urine culture positive for E.Coli and correct med. Patient status

## 2016-08-02 NOTE — Telephone Encounter (Signed)
Patient notified of results. See lab 

## 2017-01-25 ENCOUNTER — Encounter: Payer: Self-pay | Admitting: Certified Nurse Midwife

## 2017-01-25 ENCOUNTER — Ambulatory Visit (INDEPENDENT_AMBULATORY_CARE_PROVIDER_SITE_OTHER): Payer: BLUE CROSS/BLUE SHIELD | Admitting: Certified Nurse Midwife

## 2017-01-25 VITALS — BP 98/62 | HR 60 | Resp 16 | Ht 61.25 in | Wt 133.0 lb

## 2017-01-25 DIAGNOSIS — Z113 Encounter for screening for infections with a predominantly sexual mode of transmission: Secondary | ICD-10-CM

## 2017-01-25 DIAGNOSIS — R102 Pelvic and perineal pain: Secondary | ICD-10-CM

## 2017-01-25 NOTE — Progress Notes (Signed)
26 y.o. Single white  female  G0P0000 here for complaint of pain with sexual activity. Admits to use of sexual toy use and oral and hand stimulation by partner..  Pain started during sexual activity last pm.  Pain is primarily located in vagina. No partner change in past month. No condom use for protection. Contraception Nexplanon.  Patient had not experienced this type of pain, but also was active for a long time before stopping. Has not taken anything for pain or sat in warm water to see if relieved. Denies any vaginal bleeding or odorous discharge. No fever, chills, abdominal pain, or cramping. Agrees it was consensual.   ROS: Pertinent to HPI  All other ROS questions are negative except as per HPI.  Exam:   BP 98/62   Pulse 60   Resp 16   Ht 5' 1.25" (1.556 m)   Wt 133 lb (60.3 kg)   BMI 24.93 kg/m  General appearance: alert, cooperative and appears stated age Abdomen:  soft, non-tender; bowel sounds normal; no masses,  no organomegaly Lymph:  Inguinal lymph nodes no enlargement or tenderness   Pelvic: External genitalia:  no lesions and normal female appearance              Urethra: not indicated and normal appearing urethra with no masses, tenderness or lesions              Bartholins and Skenes: Bartholin's, Urethra, Skene's normal                 Vagina: normal appearing vagina with normal color and discharge, no lesions, small superficial laceration at introitus and right vaginal wall another superficial laceration noted and noted as point of pain, specimens obtained              Cervix: normal appearance and no lesions, no blood from cervix, no CMT              Pap taken: No. Bimanual Exam:  Uterus:  uterus is normal size, shape, consistency and nontender                               Adnexa:    normal adnexa in size, nontender and no masses                               Rectovaginal: Confirms                               Anus:  Normal appearance   A: Vaginal pain related to  sexual activity and ?hand or sexual toy Vaginal superficial laceration noted x 2 STD screening Normal pelvic exam except for findings  P:Reviewed findings with patient and etiology. Discussed avoidance of excessive stimulation which can also illicit pain or injury. Avoid sexual toys which can also cause injury. Questions addressed. Discussed Aveeno bath for comfort, Advil OTC 600 mg every 5-6 hours with food for discomfort. Warning signs of pain given. Reassured patient no large areas involved.  Labs:STD panel, GC/Chlamydia, Affirm  Rv prn.       An After Visit Summary was printed and given to the patient.

## 2017-01-26 ENCOUNTER — Other Ambulatory Visit: Payer: Self-pay

## 2017-01-26 LAB — HEPATITIS C ANTIBODY: HCV AB: NEGATIVE

## 2017-01-26 LAB — STD PANEL
HEP B S AG: NEGATIVE
HIV 1&2 Ab, 4th Generation: NONREACTIVE

## 2017-01-26 LAB — WET PREP BY MOLECULAR PROBE
CANDIDA SPECIES: NOT DETECTED
Gardnerella vaginalis: DETECTED — AB
TRICHOMONAS VAG: NOT DETECTED

## 2017-01-26 LAB — IPS N GONORRHOEA AND CHLAMYDIA BY PCR

## 2017-01-26 MED ORDER — METRONIDAZOLE 500 MG PO TABS
500.0000 mg | ORAL_TABLET | Freq: Two times a day (BID) | ORAL | 0 refills | Status: DC
Start: 2017-01-26 — End: 2017-06-12

## 2017-01-26 NOTE — Telephone Encounter (Signed)
lmtcb

## 2017-01-26 NOTE — Telephone Encounter (Signed)
Pt notified of results & rx sent to pharmacy. 

## 2017-01-26 NOTE — Telephone Encounter (Signed)
-----   Message from Verner Chol, CNM sent at 01/26/2017  7:19 AM EDT ----- Notify patient that Hep C is negative Wet prep positive for BV negative for yeast and trichomonas All others pending Needs Rx Flagyl one bid x 7, be sure and take with food and avoid ETOH during use Patient status

## 2017-01-30 NOTE — Progress Notes (Signed)
Encounter reviewed Jill Jertson, MD   

## 2017-06-07 ENCOUNTER — Ambulatory Visit: Payer: BLUE CROSS/BLUE SHIELD | Admitting: Obstetrics and Gynecology

## 2017-06-07 NOTE — Progress Notes (Deleted)
GYNECOLOGY  VISIT   HPI: 26 y.o.   Single  Caucasian  female   G0P0000 with No LMP recorded.   here for     GYNECOLOGIC HISTORY: No LMP recorded. Contraception:  Nexplanon inserted 07-02-15 left arm Menopausal hormone therapy:  n/a Last mammogram:  n/a Last pap smear: 06-17-15 Neg        OB History    Gravida Para Term Preterm AB Living   0 0 0 0 0 0   SAB TAB Ectopic Multiple Live Births   0 0 0 0           There are no active problems to display for this patient.   Past Medical History:  Diagnosis Date  . Anxiety   . Depression   . Fx metatarsal 11/02/2011   right 5th toe  . Hiatal hernia   . IBS (irritable bowel syndrome)   . Reflux   . STD (sexually transmitted disease) 06/2011   + chlamydia    Past Surgical History:  Procedure Laterality Date  . implannon     put in around 10/13  . nexplanon insertion  07-02-15   removal of implanon & insertion of nexplanon  . ORIF TOE FRACTURE  01/18/2012   Procedure: OPEN REDUCTION INTERNAL FIXATION (ORIF) METATARSAL (TOE) FRACTURE;  Surgeon: Loreta Ave, MD;  Location: [facility];  Service: Orthopedics;  Laterality: Right;  right 5th toe  . WISDOM TOOTH EXTRACTION      Current Outpatient Prescriptions  Medication Sig Dispense Refill  . ADDERALL XR 20 MG 24 hr capsule as needed.  0  . clonazePAM (KLONOPIN) 1 MG tablet Take 1 mg by mouth.    . DULoxetine (CYMBALTA) 30 MG capsule Take 30 mg by mouth daily.    Marland Kitchen etonogestrel (NEXPLANON) 68 MG IMPL implant 1 each by Subdermal route once.    . metroNIDAZOLE (FLAGYL) 500 MG tablet Take 1 tablet (500 mg total) by mouth 2 (two) times daily. 14 tablet 0  . Vilazodone HCl (VIIBRYD) 20 MG TABS Take by mouth daily.     No current facility-administered medications for this visit.      ALLERGIES: Patient has no known allergies.  Family History  Problem Relation Age of Onset  . Cancer Father        lypmh nodes,tonsils & tongue    Social History   Social  History  . Marital status: Single    Spouse name: N/A  . Number of children: N/A  . Years of education: N/A   Occupational History  . Not on file.   Social History Main Topics  . Smoking status: Current Some Day Smoker    Years: 0.50  . Smokeless tobacco: Never Used  . Alcohol use 0.0 oz/week     Comment: 1-2 a month  . Drug use: Yes    Types: Marijuana     Comment: occ  . Sexual activity: Yes    Partners: Female, Female    Birth control/ protection: Implant     Comment: nexplanon   Other Topics Concern  . Not on file   Social History Narrative  . No narrative on file    ROS:  Pertinent items are noted in HPI.  PHYSICAL EXAMINATION:    There were no vitals taken for this visit.    General appearance: alert, cooperative and appears stated age Head: Normocephalic, without obvious abnormality, atraumatic Neck: no adenopathy, supple, symmetrical, trachea midline and thyroid normal to inspection and palpation Lungs:  clear to auscultation bilaterally Breasts: normal appearance, no masses or tenderness, No nipple retraction or dimpling, No nipple discharge or bleeding, No axillary or supraclavicular adenopathy Heart: regular rate and rhythm Abdomen: soft, non-tender, no masses,  no organomegaly Extremities: extremities normal, atraumatic, no cyanosis or edema Skin: Skin color, texture, turgor normal. No rashes or lesions Lymph nodes: Cervical, supraclavicular, and axillary nodes normal. No abnormal inguinal nodes palpated Neurologic: Grossly normal  Pelvic: External genitalia:  no lesions              Urethra:  normal appearing urethra with no masses, tenderness or lesions              Bartholins and Skenes: normal                 Vagina: normal appearing vagina with normal color and discharge, no lesions              Cervix: no lesions                Bimanual Exam:  Uterus:  normal size, contour, position, consistency, mobility, non-tender              Adnexa: no mass,  fullness, tenderness              Rectal exam: {yes no:314532}.  Confirms.              Anus:  normal sphincter tone, no lesions  Chaperone was present for exam.  ASSESSMENT     PLAN     An After Visit Summary was printed and given to the patient.  ______ minutes face to face time of which over 50% was spent in counseling.

## 2017-06-08 ENCOUNTER — Telehealth: Payer: Self-pay | Admitting: Obstetrics and Gynecology

## 2017-06-08 NOTE — Telephone Encounter (Signed)
LMTCB about canceled appointment with Dr. Edward Jolly. Appointment for STD testing.

## 2017-06-11 ENCOUNTER — Ambulatory Visit: Payer: BLUE CROSS/BLUE SHIELD | Admitting: Obstetrics and Gynecology

## 2017-06-12 ENCOUNTER — Encounter: Payer: Self-pay | Admitting: Certified Nurse Midwife

## 2017-06-12 ENCOUNTER — Other Ambulatory Visit (HOSPITAL_COMMUNITY)
Admission: RE | Admit: 2017-06-12 | Discharge: 2017-06-12 | Disposition: A | Discharge status: Home / Self Care | Payer: BLUE CROSS/BLUE SHIELD | Source: Ambulatory Visit | Attending: Certified Nurse Midwife | Admitting: Certified Nurse Midwife

## 2017-06-12 ENCOUNTER — Ambulatory Visit (INDEPENDENT_AMBULATORY_CARE_PROVIDER_SITE_OTHER): Payer: BLUE CROSS/BLUE SHIELD | Admitting: Certified Nurse Midwife

## 2017-06-12 VITALS — BP 120/60 | HR 84 | Resp 16 | Wt 139.0 lb

## 2017-06-12 DIAGNOSIS — Z113 Encounter for screening for infections with a predominantly sexual mode of transmission: Secondary | ICD-10-CM | POA: Diagnosis present

## 2017-06-12 DIAGNOSIS — B373 Candidiasis of vulva and vagina: Secondary | ICD-10-CM | POA: Insufficient documentation

## 2017-06-12 DIAGNOSIS — Z01419 Encounter for gynecological examination (general) (routine) without abnormal findings: Secondary | ICD-10-CM

## 2017-06-12 NOTE — Progress Notes (Signed)
  25 yrs Single Caucasian female G0P0000 here for STD testing.  .  Last sexual activity:  August 16 with condom use..  Patient's last menstrual period was 06/01/2017.Marland Kitchen  Contraception: Nexplanon.  Pt denies abnormal vaginal bleeding, discharge, pelvic pain, dysuria, increased urinary frequency or urgency. Last sexual activity with previous partner with STD concerns was one month ago. Patient indicates oral screening needed also. No rectal exposure. Patient aware that there was concern about IV drug use with his other partners and would like testing specifically for this also. HIV, Hep. C, Hep B.    Exam: Vitals:   06/12/17 1354  BP: 120/60  Pulse: 84  Resp: 16  Weight: 139 lb (63 kg)    General appearance: alert, cooperative and appears stated age  Oral cavity: no lesions noted, specimens obtained Skin: warm and dry GI: soft, non tender, no rebound Pelvic: External genital normal female, no lesions or exudate BUS: negative Bladder, urethral meatus: non tender Inguinal lymph nodes: non tender, not enlarged Vagina: white non odorous discharge Cervix: normal appearance, no CMT or cervical discharge, scant blood noted, specimens obtained Uterus: normal, non tender, no masses Adnexa: normal,non tender, no masses     Assessment:Normal oral cavity appearance Normal pelvic exam STD exposure, desirous of testing  Plan:Discussed importance of protection with condom use of exposure to STD. Patient aware and thought she had a monogamous partner. Labs: GC/Chlamydia vaginally/orally, HIV,RPR, Hep B,C HSV 1,2, Trichomonas, yeast, BV.  Recommend repeat screening in 3 months. Information given on STD prevention.  Rv as above, prn

## 2017-06-12 NOTE — Patient Instructions (Signed)
Sexually Transmitted Disease  A sexually transmitted disease (STD) is a disease or infection that may be passed (transmitted) from person to person, usually during sexual activity. This may happen by way of saliva, semen, blood, vaginal mucus, or urine. Common STDs include:   Gonorrhea.   Chlamydia.   Syphilis.   HIV and AIDS.   Genital herpes.   Hepatitis B and C.   Trichomonas.   Human papillomavirus (HPV).   Pubic lice.   Scabies.   Mites.   Bacterial vaginosis.    What are the causes?  An STD may be caused by bacteria, a virus, or parasites. STDs are often transmitted during sexual activity if one person is infected. However, they may also be transmitted through nonsexual means. STDs may be transmitted after:   Sexual intercourse with an infected person.   Sharing sex toys with an infected person.   Sharing needles with an infected person or using unclean piercing or tattoo needles.   Having intimate contact with the genitals, mouth, or rectal areas of an infected person.   Exposure to infected fluids during birth.    What are the signs or symptoms?  Different STDs have different symptoms. Some people may not have any symptoms. If symptoms are present, they may include:   Painful or bloody urination.   Pain in the pelvis, abdomen, vagina, anus, throat, or eyes.   A skin rash, itching, or irritation.   Growths, ulcerations, blisters, or sores in the genital and anal areas.   Abnormal vaginal discharge with or without bad odor.   Penile discharge in men.   Fever.   Pain or bleeding during sexual intercourse.   Swollen glands in the groin area.   Yellow skin and eyes (jaundice). This is seen with hepatitis.   Swollen testicles.   Infertility.   Sores and blisters in the mouth.    How is this diagnosed?  To make a diagnosis, your health care provider may:   Take a medical history.   Perform a physical exam.   Take a sample of any discharge to examine.   Swab the throat, cervix,  opening to the penis, rectum, or vagina for testing.   Test a sample of your first morning urine.   Perform blood tests.   Perform a Pap test, if this applies.   Perform a colposcopy.   Perform a laparoscopy.    How is this treated?  Treatment depends on the STD. Some STDs may be treated but not cured.   Chlamydia, gonorrhea, trichomonas, and syphilis can be cured with antibiotic medicine.   Genital herpes, hepatitis, and HIV can be treated, but not cured, with prescribed medicines. The medicines lessen symptoms.   Genital warts from HPV can be treated with medicine or by freezing, burning (electrocautery), or surgery. Warts may come back.   HPV cannot be cured with medicine or surgery. However, abnormal areas may be removed from the cervix, vagina, or vulva.   If your diagnosis is confirmed, your recent sexual partners need treatment. This is true even if they are symptom-free or have a negative culture or evaluation. They should not have sex until their health care providers say it is okay.   Your health care provider may test you for infection again 3 months after treatment.    How is this prevented?  Take these steps to reduce your risk of getting an STD:   Use latex condoms, dental dams, and water-soluble lubricants during sexual activity. Do not use   petroleum jelly or oils.   Avoid having multiple sex partners.   Do not have sex with someone who has other sex partners.   Do not have sex with anyone you do not know or who is at high risk for an STD.   Avoid risky sex practices that can break your skin.   Do not have sex if you have open sores on your mouth or skin.   Avoid drinking too much alcohol or taking illegal drugs. Alcohol and drugs can affect your judgment and put you in a vulnerable position.   Avoid engaging in oral and anal sex acts.   Get vaccinated for HPV and hepatitis. If you have not received these vaccines in the past, talk to your health care provider about whether one or  both might be right for you.   If you are at risk of being infected with HIV, it is recommended that you take a prescription medicine daily to prevent HIV infection. This is called pre-exposure prophylaxis (PrEP). You are considered at risk if:  ? You are a man who has sex with other men (MSM).  ? You are a heterosexual man or woman and are sexually active with more than one partner.  ? You take drugs by injection.  ? You are sexually active with a partner who has HIV.   Talk with your health care provider about whether you are at high risk of being infected with HIV. If you choose to begin PrEP, you should first be tested for HIV. You should then be tested every 3 months for as long as you are taking PrEP.    Contact a health care provider if:   See your health care provider.   Tell your sexual partner(s). They should be tested and treated for any STDs.   Do not have sex until your health care provider says it is okay.  Get help right away if:  Contact your health care provider right away if:   You have severe abdominal pain.   You are a man and notice swelling or pain in your testicles.   You are a woman and notice swelling or pain in your vagina.    This information is not intended to replace advice given to you by your health care provider. Make sure you discuss any questions you have with your health care provider.  Document Released: 12/30/2002 Document Revised: 04/28/2016 Document Reviewed: 04/29/2013  Elsevier Interactive Patient Education  2018 [employer]

## 2017-06-13 LAB — HEP, RPR, HIV PANEL
HIV SCREEN 4TH GENERATION: NONREACTIVE
Hepatitis B Surface Ag: NEGATIVE
RPR: NONREACTIVE

## 2017-06-13 LAB — HSV(HERPES SIMPLEX VRS) I + II AB-IGG: HSV 1 Glycoprotein G Ab, IgG: 30.9 index — ABNORMAL HIGH (ref 0.00–0.90)

## 2017-06-13 LAB — CERVICOVAGINAL ANCILLARY ONLY
BACTERIAL VAGINITIS: NEGATIVE
CHLAMYDIA, DNA PROBE: NEGATIVE
Candida vaginitis: POSITIVE — AB
Neisseria Gonorrhea: NEGATIVE
Trichomonas: NEGATIVE

## 2017-06-13 LAB — HEPATITIS C ANTIBODY: Hep C Virus Ab: <0.1 s/co ratio (ref 0.0–0.9)

## 2017-06-14 ENCOUNTER — Other Ambulatory Visit: Payer: Self-pay

## 2017-06-14 LAB — CT/GC NAA, PHARYNGEAL
C TRACH RRNA NPH QL PCR: NEGATIVE
N GONORRHOEA RRNA NPH QL PCR: NEGATIVE

## 2017-06-14 MED ORDER — TERCONAZOLE 0.4 % VA CREA: 1.0000 | TOPICAL_CREAM | Freq: Every day | VAGINAL | 0 refills | Status: DC

## 2017-06-14 NOTE — Telephone Encounter (Signed)
Pt notified of results

## 2017-06-14 NOTE — Telephone Encounter (Signed)
-----   Message from Verner Chol, CNM sent at 06/14/2017  8:07 AM EDT ----- Notify patient that Hep B and C are negative HIV,RPR are negative HSV 1 is positive , HSV 2 is negative, has patient ever had fever blisters or oral mouth sores, or partner, this would account for positive, if not schedule appt.for discussion after other results are  in GC, Chlamydia negative BV, Trichomonas negative Yeast positive needs Rx Terazol 7 vaginal cream one applicator per vagina every hs x 7 Oral screening pending

## 2017-06-14 NOTE — Telephone Encounter (Signed)
lmtcb

## 2017-06-20 ENCOUNTER — Ambulatory Visit: Payer: BLUE CROSS/BLUE SHIELD | Admitting: Certified Nurse Midwife

## 2017-12-19 ENCOUNTER — Ambulatory Visit: Payer: BLUE CROSS/BLUE SHIELD | Admitting: Certified Nurse Midwife

## 2017-12-19 ENCOUNTER — Other Ambulatory Visit: Payer: Self-pay

## 2017-12-19 ENCOUNTER — Encounter: Payer: Self-pay | Admitting: Certified Nurse Midwife

## 2017-12-19 VITALS — BP 110/64 | HR 68 | Resp 16 | Ht 61.25 in | Wt 156.0 lb

## 2017-12-19 DIAGNOSIS — Z202 Contact with and (suspected) exposure to infections with a predominantly sexual mode of transmission: Secondary | ICD-10-CM

## 2017-12-19 DIAGNOSIS — Z113 Encounter for screening for infections with a predominantly sexual mode of transmission: Secondary | ICD-10-CM | POA: Diagnosis not present

## 2017-12-19 MED ORDER — AZITHROMYCIN 1 G PO PACK: 1.0000 | PACK | Freq: Once | ORAL | 0 refills | Status: AC

## 2017-12-19 NOTE — Progress Notes (Signed)
Subjective:     Patient ID: Jane Gonzalez, female   DOB: 10/02/1991, 27 y.o.   MRN: [id number]  Patient denies any problems, just new partner and desires STD screening. Partner had testing and was treated for Chlamydia. Patient has had no unprotected sex, uses condoms. . Patient denies any increase in vaginal discharge or symptoms.Did have oral exposure with partner,last sexual activity was 4 days ago prior to partner being diagnosed yesterday with Chlamydia and treated.  Contraception Nexplanon with recent normal period. Patient has previous history of Chlamydia treated as a teen. Denies abdominal pain or other issues. Continues on her medication for anxiety/depression, all stable. No other concerns today.     Review of Systems  Constitutional: Negative.   Gastrointestinal: Negative for abdominal distention and abdominal pain.  Genitourinary: Negative for dysuria, frequency, genital sores, pelvic pain, urgency, vaginal bleeding, vaginal discharge and vaginal pain.  Skin: Negative.   Neurological: Negative.   Psychiatric/Behavioral: Negative for sleep disturbance. The patient is not nervous/anxious.        Objective:   Physical Exam  Constitutional: She is oriented to person, place, and time. She appears well-developed and well-nourished.  Abdominal: Soft. She exhibits no distension and no mass. There is no tenderness. There is no rebound and no guarding.  Genitourinary: There is no rash, tenderness, lesion or injury on the right labia. There is no rash, tenderness, lesion or injury on the left labia. Uterus is not enlarged and not tender. Cervix exhibits no motion tenderness, no discharge and no friability. Right adnexum displays no mass, no tenderness and no fullness. Left adnexum displays no mass, no tenderness and no fullness. No erythema, tenderness or bleeding in the vagina. No foreign body in the vagina. No signs of injury around the vagina. Vaginal discharge found.  Genitourinary  Comments: Scant brown blood noted, recent menses  Lymphadenopathy:       Right: No inguinal adenopathy present.       Left: No inguinal adenopathy present.  Neurological: She is alert and oriented to person, place, and time.  Skin: Skin is warm and dry.  Psychiatric: She has a normal mood and affect. Her behavior is normal. Judgment and thought content normal.       Assessment:     Normal pelvic exam Chlamydia exposure with Partner diagnosed one day ago Previous history of chlamydia treated as a teen STD screening   Contraception Nexplanon Plan:     Discussed normal pelvic exam and no indication of chlamydia at this time, but recommend treatment due to exposure. Patient agreeable Rx Azithromycin see order with instructions sent to pharmacy Will need TOC in 3 months Labs: STD panel Hep C, Vaginal screen , Chlamydia, GC Warning signs of infection given and need to advise if occurs. Questions addressed.  Rv prn , as above

## 2017-12-20 LAB — VAGINITIS/VAGINOSIS, DNA PROBE
Candida Species: NEGATIVE
Gardnerella vaginalis: NEGATIVE
Trichomonas vaginosis: NEGATIVE

## 2017-12-20 LAB — HEPATITIS C ANTIBODY: Hep C Virus Ab: <0.1 s/co ratio (ref 0.0–0.9)

## 2017-12-20 LAB — HEP, RPR, HIV PANEL
HEP B S AG: NEGATIVE
HIV Screen 4th Generation wRfx: NONREACTIVE
RPR: NONREACTIVE

## 2017-12-21 LAB — CHLAMYDIA/GC NAA, CONFIRMATION
Chlamydia trachomatis, NAA: NEGATIVE
NEISSERIA GONORRHOEAE, NAA: NEGATIVE

## 2018-03-22 ENCOUNTER — Telehealth: Payer: Self-pay | Admitting: Certified Nurse Midwife

## 2018-03-22 NOTE — Telephone Encounter (Signed)
Spoke with patient. Patient states that she is having urinary frequency and burning with urination. Denies any fever, chills, or back pain. Advised will need to be seen for further evaluation. Recommended she be seen at a local Urgent Care for evaluation. Requests to schedule an appointment. Appointment scheduled for 03/25/2018 at 2 pm with Dr.Jertson as Leota Sauerseborah Leonard CNM is out of the office Monday. Patient is aware if her symptoms worsen she will need to be seen for further evaluation at Urgent Care over the weekend. Patient is agreeable.  Cc: Dr.Jertson  Routing to provider for final review. Patient agreeable to disposition. Will close encounter.

## 2018-03-22 NOTE — Telephone Encounter (Signed)
Patient believes she may have a UTI & would like antibiotics. If she needs to be seen, she would also like to have a birth control consultation.

## 2018-03-25 ENCOUNTER — Ambulatory Visit: Payer: BLUE CROSS/BLUE SHIELD | Admitting: Obstetrics and Gynecology

## 2018-03-25 ENCOUNTER — Telehealth: Payer: Self-pay | Admitting: Obstetrics and Gynecology

## 2018-03-25 NOTE — Telephone Encounter (Signed)
Patient called and cancelled her appointment today with Dr. Oscar LaJertson today re: UTI symptoms. She said, "My stomach is feeling too sick to leave the house. I just can't make it." Patient rescheduled to tomorrow, 03/26/18, with Dr. Oscar LaJertson at 9:00 AM. Routing to provider for FYI only.

## 2018-03-26 ENCOUNTER — Telehealth: Payer: Self-pay | Admitting: Obstetrics and Gynecology

## 2018-03-26 ENCOUNTER — Ambulatory Visit: Payer: BLUE CROSS/BLUE SHIELD | Admitting: Obstetrics and Gynecology

## 2018-03-26 NOTE — Telephone Encounter (Signed)
Patient called and cancelled her appointment with Dr. Oscar LaJertson today for "UTI symptoms" due to still being sick with stomach problems. She had cancelled yesterday and rescheduled to today. Patient declined to reschedule at this time. Patient stated she continues to have urinary tract infection symptoms but her stomach pain is worse. She also said she does not think the two problems are related and that she does not have a fever. Patient aware to call back to reschedule as soon as possible. Routing to provider for FYI.

## 2018-03-26 NOTE — Telephone Encounter (Signed)
Please call and check on the patient. Make sure she is being seen for her stomach pain.

## 2018-03-26 NOTE — Telephone Encounter (Signed)
Left message to call Kaitlyn at [phone]. 

## 2018-04-05 NOTE — Telephone Encounter (Signed)
Left message to call Jane Gonzalez at [phone]. 

## 2018-04-11 NOTE — Telephone Encounter (Signed)
Attempted to reach patient x 2 without return call. Routing to Dr.Jertson. Encounter closed.

## 2018-05-03 ENCOUNTER — Emergency Department (HOSPITAL_COMMUNITY)
Admission: EM | Admit: 2018-05-03 | Discharge: 2018-05-03 | Disposition: A | Discharge status: Home / Self Care | Payer: BLUE CROSS/BLUE SHIELD | Attending: Emergency Medicine | Admitting: Emergency Medicine

## 2018-05-03 ENCOUNTER — Emergency Department (HOSPITAL_COMMUNITY): Payer: BLUE CROSS/BLUE SHIELD

## 2018-05-03 ENCOUNTER — Other Ambulatory Visit: Payer: Self-pay

## 2018-05-03 ENCOUNTER — Encounter (HOSPITAL_COMMUNITY): Payer: Self-pay

## 2018-05-03 DIAGNOSIS — Z79899 Other long term (current) drug therapy: Secondary | ICD-10-CM | POA: Diagnosis not present

## 2018-05-03 DIAGNOSIS — H538 Other visual disturbances: Secondary | ICD-10-CM | POA: Insufficient documentation

## 2018-05-03 DIAGNOSIS — Y9389 Activity, other specified: Secondary | ICD-10-CM | POA: Insufficient documentation

## 2018-05-03 DIAGNOSIS — S0101XA Laceration without foreign body of scalp, initial encounter: Secondary | ICD-10-CM | POA: Insufficient documentation

## 2018-05-03 DIAGNOSIS — Z87891 Personal history of nicotine dependence: Secondary | ICD-10-CM | POA: Diagnosis not present

## 2018-05-03 DIAGNOSIS — R51 Headache: Secondary | ICD-10-CM | POA: Insufficient documentation

## 2018-05-03 DIAGNOSIS — Y929 Unspecified place or not applicable: Secondary | ICD-10-CM | POA: Insufficient documentation

## 2018-05-03 DIAGNOSIS — S0003XA Contusion of scalp, initial encounter: Secondary | ICD-10-CM | POA: Insufficient documentation

## 2018-05-03 DIAGNOSIS — Y998 Other external cause status: Secondary | ICD-10-CM | POA: Diagnosis not present

## 2018-05-03 DIAGNOSIS — S0990XA Unspecified injury of head, initial encounter: Secondary | ICD-10-CM | POA: Diagnosis present

## 2018-05-03 MED ORDER — ACETAMINOPHEN 500 MG PO TABS: 500.0000 mg | ORAL_TABLET | Freq: Four times a day (QID) | ORAL | 0 refills | Status: DC | PRN

## 2018-05-03 MED ORDER — ACETAMINOPHEN 500 MG PO TABS
1000.0000 mg | ORAL_TABLET | Freq: Once | ORAL | Status: AC
  Administered 2018-05-03: 1000 mg via ORAL
  Filled 2018-05-03: qty 2

## 2018-05-03 MED ORDER — IBUPROFEN 600 MG PO TABS: 600.0000 mg | ORAL_TABLET | Freq: Four times a day (QID) | ORAL | 0 refills | Status: DC | PRN

## 2018-05-03 MED ORDER — BACITRACIN ZINC 500 UNIT/GM EX OINT
TOPICAL_OINTMENT | Freq: Two times a day (BID) | CUTANEOUS | Status: DC
  Administered 2018-05-03: 1 via TOPICAL
  Filled 2018-05-03: qty 0.9

## 2018-05-03 MED ORDER — CEPHALEXIN 500 MG PO CAPS: 500.0000 mg | ORAL_CAPSULE | Freq: Four times a day (QID) | ORAL | 0 refills | Status: DC
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
## 2018-05-03 NOTE — ED Notes (Signed)
Patient transported to CT 

## 2018-05-03 NOTE — ED Notes (Signed)
Pt cleaned up with towels and sterile water. Several small lacerations found on scalp in hair. Bleeding controlled. No facial injuries noted.

## 2018-05-03 NOTE — Discharge Instructions (Signed)
Apply bacitracin to your wound once daily after washing with warm soapy water.  Avoid perfumed soaps and shampoos.  You can use ice to your head 3-4 times daily alternating 20 minutes on, 20 minutes off.  Please return to the emergency department in 7 days for removal of your staples.  Please return sooner if you develop any increasing pain, redness, swelling, red streaking from the wound, drainage, or fevers.
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
## 2018-05-11 ENCOUNTER — Emergency Department (HOSPITAL_COMMUNITY)
Admission: EM | Admit: 2018-05-11 | Discharge: 2018-05-11 | Discharge status: Against Medical Advice | Payer: BLUE CROSS/BLUE SHIELD | Attending: Emergency Medicine | Admitting: Emergency Medicine

## 2018-05-11 ENCOUNTER — Encounter (HOSPITAL_COMMUNITY): Payer: Self-pay | Admitting: Emergency Medicine

## 2018-05-11 DIAGNOSIS — Z4802 Encounter for removal of sutures: Secondary | ICD-10-CM | POA: Insufficient documentation

## 2018-05-11 DIAGNOSIS — Z5321 Procedure and treatment not carried out due to patient leaving prior to being seen by health care provider: Secondary | ICD-10-CM | POA: Insufficient documentation

## 2018-05-11 NOTE — ED Notes (Signed)
Na x3, patient LWBS

## 2018-05-11 NOTE — ED Notes (Signed)
nax2

## 2018-05-11 NOTE — ED Triage Notes (Signed)
Pt here for staple removal to L scalp.

## 2018-05-11 NOTE — ED Notes (Signed)
Na x 1 for room. 

## 2018-05-13 NOTE — ED Notes (Signed)
Follow up call made  No answer  05/13/18  0922  s Jaycelyn Orrison rn

## 2018-05-23 ENCOUNTER — Ambulatory Visit: Payer: BLUE CROSS/BLUE SHIELD | Admitting: Certified Nurse Midwife

## 2018-05-23 ENCOUNTER — Ambulatory Visit: Payer: Self-pay | Admitting: Obstetrics & Gynecology

## 2018-05-23 ENCOUNTER — Encounter: Payer: Self-pay | Admitting: Certified Nurse Midwife

## 2018-05-23 ENCOUNTER — Telehealth: Payer: Self-pay | Admitting: Obstetrics and Gynecology

## 2018-05-23 VITALS — BP 102/62 | HR 64 | Temp 98.2°F | Resp 16 | Ht 61.25 in | Wt 152.0 lb

## 2018-05-23 DIAGNOSIS — N898 Other specified noninflammatory disorders of vagina: Secondary | ICD-10-CM

## 2018-05-23 DIAGNOSIS — Z113 Encounter for screening for infections with a predominantly sexual mode of transmission: Secondary | ICD-10-CM | POA: Diagnosis not present

## 2018-05-23 DIAGNOSIS — N390 Urinary tract infection, site not specified: Secondary | ICD-10-CM

## 2018-05-23 LAB — POCT URINALYSIS DIPSTICK
Bilirubin, UA: NEGATIVE
Glucose, UA: NEGATIVE
Ketones, UA: NEGATIVE
LEUKOCYTES UA: NEGATIVE
Nitrite, UA: NEGATIVE
Protein, UA: NEGATIVE
RBC UA: NEGATIVE
UROBILINOGEN UA: NEGATIVE U/dL — AB
pH, UA: 5 (ref 5.0–8.0)

## 2018-05-23 NOTE — Telephone Encounter (Signed)
Patient stated that she thought she had a yeast infection, but she has been experiencing "lots of strange symptoms." Patient would like to be seen as soon as possible, and to get std testing.
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
## 2018-05-24 ENCOUNTER — Telehealth: Payer: Self-pay | Admitting: *Deleted

## 2018-05-24 LAB — GC/CHLAMYDIA PROBE AMP
Chlamydia trachomatis, NAA: NEGATIVE
Neisseria gonorrhoeae by PCR: NEGATIVE

## 2018-05-24 LAB — HEP, RPR, HIV PANEL
HIV SCREEN 4TH GENERATION: NONREACTIVE
Hepatitis B Surface Ag: NEGATIVE
RPR: NONREACTIVE

## 2018-05-24 LAB — VAGINITIS/VAGINOSIS, DNA PROBE
CANDIDA SPECIES: NEGATIVE
Gardnerella vaginalis: POSITIVE — AB
Trichomonas vaginosis: NEGATIVE

## 2018-05-24 LAB — HEPATITIS C ANTIBODY

## 2018-05-24 NOTE — Telephone Encounter (Signed)
Notes recorded by Leda MinHamm, Jevaun Strick N, RN on 05/24/2018 at 4:58 PM EDT Left detailed message, ok per dpr. Advised of results as seen below per Leota Sauerseborah Leonard, CNM. Please return call to office to confirm pharmacy for metrogel.

## 2018-05-24 NOTE — Telephone Encounter (Signed)
-----   Message from Verner Choleborah S Leonard, CNM sent at 05/24/2018  4:44 PM EDT ----- Notify patient her GC,Chlamydia, HIV,RPR, Hep B,C Vaginal screen was negative for yeast and trichomonas,but positive for BV. Needs Rx Metrogel for treatment. Has she been using consistent condoms,if no she needs to wait until period to treat.

## 2018-05-25 LAB — URINE CULTURE

## 2018-05-27 NOTE — Telephone Encounter (Signed)
Left message to call Oneil Behney at [phone].  

## 2018-05-28 MED ORDER — METRONIDAZOLE 0.75 % VA GEL: VAGINAL | 0 refills | Status: DC

## 2018-05-28 NOTE — Telephone Encounter (Signed)
Patient is returning Jill's call. °

## 2018-05-28 NOTE — Telephone Encounter (Signed)
Left message to call Mars Scheaffer at [phone].  

## 2018-05-28 NOTE — Telephone Encounter (Signed)
Patient returned call and message from Leota Sauerseborah Leonard CNM discussed. Pharmacy confirmed.  Metrogel and instructions given. Pt verbalized understanding. Will call back with any questions or concerns.  Routing to provider for final review. Patient agreeable to disposition. Will close encounter.

## 2018-05-29 ENCOUNTER — Telehealth: Payer: Self-pay | Admitting: *Deleted

## 2018-05-29 ENCOUNTER — Ambulatory Visit: Payer: BLUE CROSS/BLUE SHIELD | Admitting: Certified Nurse Midwife

## 2018-05-29 ENCOUNTER — Encounter: Payer: Self-pay | Admitting: Certified Nurse Midwife

## 2018-05-29 ENCOUNTER — Other Ambulatory Visit: Payer: Self-pay

## 2018-05-29 VITALS — BP 104/70 | HR 64 | Resp 16 | Ht 61.25 in | Wt 150.0 lb

## 2018-05-29 DIAGNOSIS — Z30430 Encounter for insertion of intrauterine contraceptive device: Secondary | ICD-10-CM

## 2018-05-29 DIAGNOSIS — Z3009 Encounter for other general counseling and advice on contraception: Secondary | ICD-10-CM

## 2018-05-29 DIAGNOSIS — Z3046 Encounter for surveillance of implantable subdermal contraceptive: Secondary | ICD-10-CM
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
## 2018-05-29 NOTE — Telephone Encounter (Signed)
Left message to call Noreene LarssonJill at [phone].   Orders placed for nexplanon removal and IUD insertion. Schedule with Dr. Barbie BannerJertson Nexplanon expires 07/01/18.

## 2018-05-29 NOTE — Patient Instructions (Signed)

## 2018-05-29 NOTE — Telephone Encounter (Signed)
-----   Message from Verner Choleborah S Leonard, CNM sent at 05/29/2018  1:13 PM EDT ----- Patient needs appointment for Paragard IUD insertion with Dr. Oscar LaJertson. Has Nexplanon which needs removal. Would like to have done soon.

## 2018-05-31 NOTE — Telephone Encounter (Signed)
Call placed to convey benefits. 

## 2018-05-31 NOTE — Telephone Encounter (Signed)
Left message to call Ishi Danser at [phone].  

## 2018-05-31 NOTE — Telephone Encounter (Signed)
Spoke with patient, advised as seen below per Leota Sauerseborah Leonard, CNM. Nexplanon removal and IUD insertion scheduled for 8/19 at 2:30pm with Dr. Oscar LaJertson. Advised to take Motrin 800 mg with food and water one hour before procedure. Patient verbalizes understanding and is agreeable. Encounter closed.

## 2018-06-05 NOTE — Progress Notes (Signed)
GYNECOLOGY  VISIT   HPI: 27 y.o.   Single  Caucasian  female   G0P0000 with Patient5's last menstrual period was 06/06/2018 (exact date).   here for nexplanon removal and Paragard IUD insertion. Recent negative genprobe. She has been on hormonal birth control since she was [age] and doesn't know what her baseline menses are like. She doesn't want to be on any hormones. Has no plans for children in the future.  GYNECOLOGIC HISTORY: Patient's last menstrual period was 06/06/2018 (exact date). Contraception:nexplanon Menopausal hormone therapy: n/a        OB History    Gravida  0   Para  0   Term  0   Preterm  0   AB  0   Living  0     SAB  0   TAB  0   Ectopic  0   Multiple  0   Live Births                 There are no active problems to display for this patient.   Past Medical History:  Diagnosis Date  . Anxiety   . Depression   . Fx metatarsal 11/02/2011   right 5th toe  . Hiatal hernia   . IBS (irritable bowel syndrome)   . Reflux   . STD (sexually transmitted disease) 06/2011   + chlamydia    Past Surgical History:  Procedure Laterality Date  . implannon     put in around 10/13  . nexplanon insertion  07-02-15   removal of implanon & insertion of nexplanon  . ORIF TOE FRACTURE  01/18/2012   Procedure: OPEN REDUCTION INTERNAL FIXATION (ORIF) METATARSAL (TOE) FRACTURE;  Surgeon: Loreta Aveaniel F Murphy, MD;  Location: [facility];  Service: Orthopedics;  Laterality: Right;  right 5th toe  . WISDOM TOOTH EXTRACTION      Current Outpatient Medications  Medication Sig Dispense Refill  . acetaminophen (TYLENOL) 500 MG tablet Take 1 tablet (500 mg total) by mouth every 6 (six) hours as needed. 30 tablet 0  . ADDERALL XR 20 MG 24 hr capsule Take 20 mg by mouth daily.   0  . clonazePAM (KLONOPIN) 0.5 MG tablet Take 0.5 mg by mouth daily as needed for anxiety.    . DULoxetine (CYMBALTA) 30 MG capsule Take 30 mg by mouth daily.    Marland Kitchen. etonogestrel  (NEXPLANON) 68 MG IMPL implant 1 each by Subdermal route once.    . fluticasone (FLONASE) 50 MCG/ACT nasal spray Place 1 spray into both nostrils daily.    Marland Kitchen. gabapentin (NEURONTIN) 600 MG tablet Take 1 tablet by mouth daily as needed.  0  . ibuprofen (ADVIL,MOTRIN) 600 MG tablet Take 1 tablet (600 mg total) by mouth every 6 (six) hours as needed. 30 tablet 0  . Vilazodone HCl (VIIBRYD) 20 MG TABS Take by mouth.     No current facility-administered medications for this visit.      ALLERGIES: Patient has no known allergies.  Family History  Problem Relation Age of Onset  . Cancer Father        lypmh nodes,tonsils & tongue    Social History   Socioeconomic History  . Marital status: Single    Spouse name: Not on file  . Number of children: Not on file  . Years of education: Not on file  . Highest education level: Not on file  Occupational History  . Not on file  Social Needs  . Physicist, medicalinancial resource  strain: Not on file  . Food insecurity:    Worry: Not on file    Inability: Not on file  . Transportation needs:    Medical: Not on file    Non-medical: Not on file  Tobacco Use  . Smoking status: Former Smoker    Years: 0.50  . Smokeless tobacco: Never Used  Substance and Sexual Activity  . Alcohol use: Yes    Alcohol/week: 0.0 standard drinks    Comment: 1-2 a month  . Drug use: No  . Sexual activity: Yes    Partners: Female, Female    Birth control/protection: Implant    Comment: nexplanon, female partner right now  Lifestyle  . Physical activity:    Days per week: Not on file    Minutes per session: Not on file  . Stress: Not on file  Relationships  . Social connections:    Talks on phone: Not on file    Gets together: Not on file    Attends religious service: Not on file    Active member of club or organization: Not on file    Attends meetings of clubs or organizations: Not on file    Relationship status: Not on file  . Intimate partner violence:    Fear of current  or ex partner: Not on file    Emotionally abused: Not on file    Physically abused: Not on file    Forced sexual activity: Not on file  Other Topics Concern  . Not on file  Social History Narrative  . Not on file    Review of Systems  Constitutional: Negative.   HENT: Negative.   Eyes: Negative.   Respiratory: Negative.   Cardiovascular: Negative.   Gastrointestinal: Negative.   Genitourinary: Negative.   Musculoskeletal: Negative.   Skin: Negative.   Neurological: Negative.   Endo/Heme/Allergies: Negative.   Psychiatric/Behavioral: Negative.   All other systems reviewed and are negative.   PHYSICAL EXAMINATION:    BP 128/82 (BP Location: Right Arm, Patient Position: Sitting)   Pulse 76   Wt 150 lb 3.2 oz (68.1 kg)   LMP 06/06/2018 (Exact Date)   BMI 28.15 kg/m     General appearance: alert, cooperative and appears stated age Left upper extremity: nexplanon palpated in correct position.  Pelvic: External genitalia:  no lesions              Urethra:  normal appearing urethra with no masses, tenderness or lesions              Bartholins and Skenes: normal                 Vagina: normal appearing vagina with normal color and discharge, no lesions              Cervix: no lesions              Bimanual Exam:  Uterus:  normal size, contour, position, consistency, mobility, non-tender, retroverted              Adnexa: no mass, fullness, tenderness   The risks of the Paragard IUD were reviewed with the patient, including infection, abnormal bleeding and uterine perfortion. Consent was signed.  A speculum was placed in the vagina, the cervix was cleansed with betadine. A tenaculum was placed on the cervix, the uterus sounded to 7 cm. The cervix was dilated to a 5 hagar dilator  The Paragard IUD was inserted without difficulty. The string were cut to 2-3  cm. The tenaculum was removed. Slight oozing from the tenaculum site was stopped with pressure.   The patient tolerated the  procedure well.   Risks of nexplanon removal was reviewed with the patient and a consent was signed.  The patient was placed in the supine position with her left arm bent at the elbow. The area was cleansed with betadine and injected with 1% lidocaine. A #11 blade was used to incise over the distal end of the nexplanon implant. The implant was grasped with a clamp and removed.   The patients arm was cleansed of betadine and a steri strip was placed over the incision. A gauze was wrapped around her arm.  She tolerated the procedure well  Instructions for care were discussed.                 Chaperone was present for exam.  ASSESSMENT Contraception management    PLAN Paragard IUD placed Nexplanon removed F/U in one month Call with any concerns   An After Visit Summary was printed and given to the patient.  CC: Sara Chu, CNM

## 2018-06-10 ENCOUNTER — Ambulatory Visit (INDEPENDENT_AMBULATORY_CARE_PROVIDER_SITE_OTHER): Payer: BLUE CROSS/BLUE SHIELD | Admitting: Obstetrics and Gynecology

## 2018-06-10 ENCOUNTER — Other Ambulatory Visit: Payer: Self-pay

## 2018-06-10 ENCOUNTER — Encounter: Payer: Self-pay | Admitting: Obstetrics and Gynecology

## 2018-06-10 VITALS — BP 128/82 | HR 76 | Wt 150.2 lb

## 2018-06-10 DIAGNOSIS — Z3046 Encounter for surveillance of implantable subdermal contraceptive: Secondary | ICD-10-CM | POA: Diagnosis not present

## 2018-06-10 DIAGNOSIS — Z01812 Encounter for preprocedural laboratory examination: Secondary | ICD-10-CM | POA: Diagnosis not present

## 2018-06-10 DIAGNOSIS — Z30430 Encounter for insertion of intrauterine contraceptive device: Secondary | ICD-10-CM

## 2018-06-10 DIAGNOSIS — Z3009 Encounter for other general counseling and advice on contraception: Secondary | ICD-10-CM | POA: Diagnosis not present

## 2018-06-10 LAB — POCT URINE PREGNANCY: PREG TEST UR: NEGATIVE

## 2018-06-10 NOTE — Patient Instructions (Signed)
IUD Post-procedure Instructions . Cramping is common.  You may take Ibuprofen, Aleve, or Tylenol for the cramping.  This should resolve within 24 hours.   . You may have a small amount of spotting.  You should wear a mini pad for the next few days. . You may have intercourse in 24 hours. . You need to call the office if you have any pelvic pain, fever, heavy bleeding, or foul smelling vaginal discharge. . Shower or bathe as normal USE CONDOMS FOR STD PROTECTION

## 2018-07-09 ENCOUNTER — Telehealth: Payer: Self-pay | Admitting: Certified Nurse Midwife

## 2018-07-09 NOTE — Telephone Encounter (Signed)
Reviewed with Leota Sauerseborah Leonard, CNM, call returned to patient, left detailed message, ok per dpr. Advised Leota Sauerseborah Leonard, CNM will address IUD strings at f/u visit on 9/19, return call to office if any additional questions.   Routing to provider for final review.  Will close encounter.

## 2018-07-09 NOTE — Telephone Encounter (Signed)
Patient calling regarding appointment tomorrow 07/10/18. States one of the strings of her IUD didn't get cut short enough. Wanted to let nurse know before appointment tomorrow so it could be addressed.

## 2018-07-11 ENCOUNTER — Other Ambulatory Visit: Payer: Self-pay

## 2018-07-11 ENCOUNTER — Encounter: Payer: Self-pay | Admitting: Certified Nurse Midwife

## 2018-07-11 ENCOUNTER — Ambulatory Visit: Payer: BLUE CROSS/BLUE SHIELD | Admitting: Certified Nurse Midwife

## 2018-07-11 VITALS — BP 106/64 | HR 70 | Resp 16 | Ht 61.25 in | Wt 145.0 lb

## 2018-07-11 DIAGNOSIS — Z30431 Encounter for routine checking of intrauterine contraceptive device: Secondary | ICD-10-CM | POA: Diagnosis not present

## 2018-07-11 NOTE — Progress Notes (Signed)
Review of Systems  Constitutional: Negative.   Eyes: Negative.   Respiratory: Negative.   Cardiovascular: Negative.   Gastrointestinal: Negative.   Genitourinary: Negative.   Musculoskeletal: Negative.   Skin: Negative.   Neurological: Negative.   Psychiatric/Behavioral: Negative.     27 y.o. Single Caucasian female G0P0000 here for follow up of Paragard IUD inserted by Dr. Oscar LaJertson on 06/10/18.  Patient feels strings are too long and would like to have them trimmed if possible. First period onset today. Sexually active, no STD concerns. Some cramping after insertion and used Advil for relief, continued for 3-4 days.  "loves it now".  Denies any warning signs with IUD.  O: Healthy WD,WN female Affect: normal, orientation x 3 Skin:warm and dry Abdomen:soft,non tender Pelvic exam:EXTERNAL GENITALIA: normal appearing vulva with no masses, tenderness or lesions VAGINA: no abnormal discharge or lesions and scant blood noted CERVIX: no lesions or cervical motion tenderness and scant blood noted from cervix with IUD strings visualized. IUD strings clipped to 2 1/2 cm. Strings clipped shown to patient. UTERUS: normal, no masses, non tender ADNEXA: no masses palpable and nontender RECTUM: normal appearance  A:Normal pelvic exam Normal surveillance of Paragard IUD Strings trimmed    P: Discussed findings of normal follow up with IUD. Discussed string should not be a problem now and woul have mucous accumulation on them which is normal. Warning signs with IUD given. Questions addressed.  Rv prn, aex

## 2018-08-18 ENCOUNTER — Emergency Department (HOSPITAL_COMMUNITY)
Admission: EM | Admit: 2018-08-18 | Discharge: 2018-08-18 | Disposition: A | Discharge status: Home / Self Care | Payer: BLUE CROSS/BLUE SHIELD | Attending: Emergency Medicine | Admitting: Emergency Medicine

## 2018-08-18 ENCOUNTER — Encounter (HOSPITAL_COMMUNITY): Payer: Self-pay | Admitting: Emergency Medicine

## 2018-08-18 ENCOUNTER — Emergency Department (HOSPITAL_COMMUNITY): Payer: BLUE CROSS/BLUE SHIELD

## 2018-08-18 DIAGNOSIS — S93402A Sprain of unspecified ligament of left ankle, initial encounter: Secondary | ICD-10-CM | POA: Diagnosis not present

## 2018-08-18 DIAGNOSIS — X500XXA Overexertion from strenuous movement or load, initial encounter: Secondary | ICD-10-CM | POA: Insufficient documentation

## 2018-08-18 DIAGNOSIS — Z79899 Other long term (current) drug therapy: Secondary | ICD-10-CM | POA: Insufficient documentation

## 2018-08-18 DIAGNOSIS — Y9301 Activity, walking, marching and hiking: Secondary | ICD-10-CM | POA: Diagnosis not present

## 2018-08-18 DIAGNOSIS — Y998 Other external cause status: Secondary | ICD-10-CM | POA: Insufficient documentation

## 2018-08-18 DIAGNOSIS — F419 Anxiety disorder, unspecified: Secondary | ICD-10-CM | POA: Diagnosis not present

## 2018-08-18 DIAGNOSIS — S99912A Unspecified injury of left ankle, initial encounter: Secondary | ICD-10-CM | POA: Diagnosis present

## 2018-08-18 DIAGNOSIS — Y9289 Other specified places as the place of occurrence of the external cause: Secondary | ICD-10-CM | POA: Insufficient documentation

## 2018-08-18 LAB — PREGNANCY, URINE: Preg Test, Ur: NEGATIVE

## 2018-08-18 NOTE — ED Notes (Signed)
Patient ambulated to restroom just before discharge without difficulty.

## 2018-08-18 NOTE — ED Provider Notes (Signed)
[facility]-EMERGENCY DEPT Provider Note   CSN: [id number] Arrival date & time: 08/18/18  0413     History   Chief Complaint Chief Complaint  Patient presents with  . Ankle Pain    HPI Jane Gonzalez is a 27 y.o. female who presents to ED for evaluation of left ankle pain that occurred prior to arrival.  States that she was wearing platform heels for her Halloween costume when she twisted her ankle.  States that she has had pain with bearing weight since then.  She took a "pain pill" prior to arrival which has helped.  She reports prior fracture in the area several years ago which healed with immobilization.  Denies any other injuries, changes to sensation, wounds.  HPI  Past Medical History:  Diagnosis Date  . Anxiety   . Depression   . Fx metatarsal 11/02/2011   right 5th toe  . Hiatal hernia   . IBS (irritable bowel syndrome)   . Reflux   . STD (sexually transmitted disease) 06/2011   + chlamydia    There are no active problems to display for this patient.   Past Surgical History:  Procedure Laterality Date  . implannon     put in around 10/13 & removed  . INTRAUTERINE DEVICE INSERTION     06-10-18 paragard inserted  . nexplanon insertion  07/02/2015   removal of implanon & insertion of nexplanon, removed  . ORIF TOE FRACTURE  01/18/2012   Procedure: OPEN REDUCTION INTERNAL FIXATION (ORIF) METATARSAL (TOE) FRACTURE;  Surgeon: Loreta Ave, MD;  Location: [facility];  Service: Orthopedics;  Laterality: Right;  right 5th toe  . WISDOM TOOTH EXTRACTION       OB History    Gravida  0   Para  0   Term  0   Preterm  0   AB  0   Living  0     SAB  0   TAB  0   Ectopic  0   Multiple  0   Live Births               Home Medications    Prior to Admission medications   Medication Sig Start Date End Date Taking? Authorizing Provider  ADDERALL XR 20 MG 24 hr capsule Take 20 mg by mouth daily.  07/20/16  Yes  [provider]  clonazePAM (KLONOPIN) 0.5 MG tablet Take 0.5 mg by mouth daily as needed for anxiety.   Yes [provider]  DULoxetine (CYMBALTA) 30 MG capsule Take 30 mg by mouth daily.   Yes [provider]  fluticasone (FLONASE) 50 MCG/ACT nasal spray Place 1 spray into both nostrils daily.   Yes [provider]  gabapentin (NEURONTIN) 600 MG tablet Take 600 mg by mouth daily as needed (pain).  05/07/18  Yes [provider]  Vilazodone HCl (VIIBRYD) 20 MG TABS Take 40 mg by mouth daily.    Yes [provider]    Family History Family History  Problem Relation Age of Onset  . Cancer Father        lypmh nodes,tonsils & tongue    Social History Social History   Tobacco Use  . Smoking status: Former Smoker    Years: 0.50  . Smokeless tobacco: Never Used  Substance Use Topics  . Alcohol use: Yes    Alcohol/week: 0.0 standard drinks    Comment: 1-2 a month  . Drug use: No  Allergies   Patient has no known allergies.   Review of Systems Review of Systems  Constitutional: Negative for chills and fever.  Musculoskeletal: Positive for arthralgias and joint swelling.  Skin: Negative for wound.  Neurological: Negative for weakness and numbness.     Physical Exam Updated Vital Signs BP 130/82 (BP Location: Right Arm)   Pulse 100   Temp 97.9 F (36.6 C) (Oral)   Resp 18   LMP 08/11/2018   SpO2 97%   Physical Exam  Constitutional: She appears well-developed and well-nourished. No distress.  HENT:  Head: Normocephalic and atraumatic.  Eyes: Conjunctivae and EOM are normal. No scleral icterus.  Neck: Normal range of motion.  Pulmonary/Chest: Effort normal. No respiratory distress.  Musculoskeletal: Normal range of motion. She exhibits edema and tenderness. She exhibits no deformity.  Edema and tenderness palpation of the left lateral malleolus.  No deformity noted.  No overlying skin changes noted.  2+ DP pulse  palpated.  Sensation intact to light touch of bilateral lower extremities.  Able to wiggle toes without difficulty.  Neurological: She is alert.  Skin: No rash noted. She is not diaphoretic.  Psychiatric: She has a normal mood and affect.  Nursing note and vitals reviewed.    ED Treatments / Results  Labs (all labs ordered are listed, but only abnormal results are displayed) Labs Reviewed  PREGNANCY, URINE    EKG None  Radiology Dg Ankle Complete Left  Result Date: 08/18/2018 CLINICAL DATA:  27 year old female with trauma to the left ankle. EXAM: LEFT ANKLE COMPLETE - 3+ VIEW COMPARISON:  None. FINDINGS: There is no acute fracture or dislocation. The bones are well mineralized. No arthritic changes. There is soft tissue swelling over the lateral malleolus. No radiopaque foreign object or soft tissue gas. IMPRESSION: 1. No acute fracture or dislocation. 2. Soft tissue swelling over the lateral malleolus. Electronically Signed   By: Elgie Collard M.D.   On: 08/18/2018 06:28    Procedures Procedures (including critical care time)  Medications Ordered in ED Medications - No data to display   Initial Impression / Assessment and Plan / ED Course  I have reviewed the triage vital signs and the nursing notes.  Pertinent labs & imaging results that were available during my care of the patient were reviewed by me and considered in my medical decision making (see chart for details).     27 year old female presents to ED for left ankle pain.  She was walking in platform heels when she twisted her ankle.  Reports improvement with a "pain pill" that she took prior to arrival.  Has have a history of prior fracture in this area several years ago which healed with immobilization.  Denies any other injuries.  On exam there is tenderness palpation and edema noted of the left lateral malleolus.  No deformity or overlying skin changes noted.  Area is neurovascularly intact.  X-ray shows no acute  fracture or dislocation.  There is soft tissue swelling that could be indicative of ligament sprain.  Will give instructions on rice therapy.  Advised to take Tylenol and ibuprofen as needed for pain. Doubt infectious or vascular cause of symptoms.  Advised to return to ED for any severe worsening symptoms.  Portions of this note were generated with Scientist, clinical (histocompatibility and immunogenetics). Dictation errors may occur despite best attempts at proofreading.   Final Clinical Impressions(s) / ED Diagnoses   Final diagnoses:  Sprain of left ankle, unspecified ligament, initial encounter    ED  Discharge Orders    None       Dietrich Pates, [place] 08/18/18 1610    Lorre Nick, MD 08/18/18 [phone]

## 2018-08-18 NOTE — ED Triage Notes (Signed)
Patient was walking in platform shoes. Patient tripped over her shoes and twisted her ankle. Patient left ankle is swollen.

## 2018-08-18 NOTE — Discharge Instructions (Addendum)
Return to ED for worsening symptoms, additional injuries or falls, increased swelling or redness of your joints, numbness in arms or legs. Take ibuprofen and Tylenol to help with your pain.

## 2018-10-09 ENCOUNTER — Ambulatory Visit (INDEPENDENT_AMBULATORY_CARE_PROVIDER_SITE_OTHER): Payer: Self-pay | Admitting: Certified Nurse Midwife

## 2018-10-09 ENCOUNTER — Other Ambulatory Visit: Payer: Self-pay

## 2018-10-09 ENCOUNTER — Encounter: Payer: Self-pay | Admitting: Certified Nurse Midwife

## 2018-10-09 VITALS — BP 110/72 | HR 70 | Temp 98.5°F | Resp 16 | Wt 144.0 lb

## 2018-10-09 DIAGNOSIS — Z113 Encounter for screening for infections with a predominantly sexual mode of transmission: Secondary | ICD-10-CM

## 2018-10-09 DIAGNOSIS — N390 Urinary tract infection, site not specified: Secondary | ICD-10-CM

## 2018-10-09 DIAGNOSIS — Z01419 Encounter for gynecological examination (general) (routine) without abnormal findings: Secondary | ICD-10-CM

## 2018-10-09 LAB — POCT URINALYSIS DIPSTICK
Bilirubin, UA: NEGATIVE
Blood, UA: NEGATIVE
GLUCOSE UA: NEGATIVE
Ketones, UA: NEGATIVE
Nitrite, UA: POSITIVE
Protein, UA: NEGATIVE
Urobilinogen, UA: NEGATIVE E.U./dL — AB
pH, UA: 5 (ref 5.0–8.0)

## 2018-10-09 MED ORDER — NITROFURANTOIN MONOHYD MACRO 100 MG PO CAPS: ORAL_CAPSULE | ORAL | 0 refills | Status: DC

## 2018-10-09 NOTE — Patient Instructions (Signed)
Urinary Tract Infection, Adult A urinary tract infection (UTI) is an infection of any part of the urinary tract. The urinary tract includes:  The kidneys.  The ureters.  The bladder.  The urethra. These organs make, store, and get rid of pee (urine) in the body. What are the causes? This is caused by germs (bacteria) in your genital area. These germs grow and cause swelling (inflammation) of your urinary tract. What increases the risk? You are more likely to develop this condition if:  You have a small, thin tube (catheter) to drain pee.  You cannot control when you pee or poop (incontinence).  You are female, and: ? You use these methods to prevent pregnancy: ? A medicine that kills sperm (spermicide). ? A device that blocks sperm (diaphragm). ? You have low levels of a female hormone (estrogen). ? You are pregnant.  You have genes that add to your risk.  You are sexually active.  You take antibiotic medicines.  You have trouble peeing because of: ? A prostate that is bigger than normal, if you are female. ? A blockage in the part of your body that drains pee from the bladder (urethra). ? A kidney stone. ? A nerve condition that affects your bladder (neurogenic bladder). ? Not getting enough to drink. ? Not peeing often enough.  You have other conditions, such as: ? Diabetes. ? A weak disease-fighting system (immune system). ? Sickle cell disease. ? Gout. ? Injury of the spine. What are the signs or symptoms? Symptoms of this condition include:  Needing to pee right away (urgently).  Peeing often.  Peeing small amounts often.  Pain or burning when peeing.  Blood in the pee.  Pee that smells bad or not like normal.  Trouble peeing.  Pee that is cloudy.  Fluid coming from the vagina, if you are female.  Pain in the belly or lower back. Other symptoms include:  Throwing up (vomiting).  No urge to eat.  Feeling mixed up (confused).  Being tired  and grouchy (irritable).  A fever.  Watery poop (diarrhea). How is this treated? This condition may be treated with:  Antibiotic medicine.  Other medicines.  Drinking enough water. Follow these instructions at home:  Medicines  Take over-the-counter and prescription medicines only as told by your doctor.  If you were prescribed an antibiotic medicine, take it as told by your doctor. Do not stop taking it even if you start to feel better. General instructions  Make sure you: ? Pee until your bladder is empty. ? Do not hold pee for a long time. ? Empty your bladder after sex. ? Wipe from front to back after pooping if you are a female. Use each tissue one time when you wipe.  Drink enough fluid to keep your pee pale yellow.  Keep all follow-up visits as told by your doctor. This is important. Contact a doctor if:  You do not get better after 1-2 days.  Your symptoms go away and then come back. Get help right away if:  You have very bad back pain.  You have very bad pain in your lower belly.  You have a fever.  You are sick to your stomach (nauseous).  You are throwing up. Summary  A urinary tract infection (UTI) is an infection of any part of the urinary tract.  This condition is caused by germs in your genital area.  There are many risk factors for a UTI. These include having a small, thin   tube to drain pee and not being able to control when you pee or poop.  Treatment includes antibiotic medicines for germs.  Drink enough fluid to keep your pee pale yellow. This information is not intended to replace advice given to you by your health care provider. Make sure you discuss any questions you have with your health care provider. Document Released: 03/27/2008 Document Revised: 04/18/2018 Document Reviewed: 04/18/2018 Elsevier Interactive Patient Education  2019 Elsevier Inc.  

## 2018-10-09 NOTE — Progress Notes (Signed)
[age] y.o. Single Caucasian female G0P0000 here with complaint of UTI, with onset  on 10 days.. Patient complaining of urinary frequency/urgency/ and pain with urination. Patient denies fever,occarional chills. Has been taking temperature and no fever. Questionable bladder symptoms. Urine has odor and cloudy. Occasional  back pain. No new personal products. Patient feels related  to sexual activity. Denies any vaginal symptoms.    Contraception is Paragard IUD.Patient is consuming adequate water intake. Occasional feels like she will vomit with GI reflux. Has been taking Azo, but not resolving. No other health issues today.  Review of Systems  Constitutional: Positive for chills.  HENT:       Headache  Gastrointestinal: Positive for constipation, nausea and vomiting.       Pain, bloating  Genitourinary: Positive for dysuria, frequency and urgency.  Endo/Heme/Allergies:       Excessive thirst    O: Healthy female WDWN Affect: Normal, orientation x 3 Skin : warm and dry CVAT: negative bilateral Abdomen: positive for suprapubic tenderness   Pelvic exam: External genital area: normal, no lesions Bladder,Urethra tender, Urethral meatus: tender, red Vagina: normal vaginal discharge, normal appearance   Cervix: normal, non tender Uterus:normal,non tender Adnexa: normal non tender, no fullness or masses   A: UTI suspect post coital Normal pelvic exam poct urine- nitirite pos, WBC 1+ STD screening Contraception Paragard IUD  P: Reviewed findings of UTI and need for treatment. Discussed suspect post coital UTI and etiology. Would need to take one Macrobid after sexual activity for prevention. Will wait on culture to determine. Be sure and empty bladder before and after sexual activity. GN:FAOZHYQMRx:Macrobid 100 mg bid x 7 VHQ:IONGELab:Urine  culture Reviewed warning signs and symptoms of UTI and need to advise if occurring. Encouraged to limit soda, tea, and coffee and be sure to increase water intake. Lab:  GC/Chlamydia, Affirm will treat if indicated Stressed condom use.  RV prn

## 2018-10-10 ENCOUNTER — Other Ambulatory Visit: Payer: Self-pay

## 2018-10-10 LAB — VAGINITIS/VAGINOSIS, DNA PROBE
Candida Species: NEGATIVE
Gardnerella vaginalis: POSITIVE — AB
TRICHOMONAS VAG: NEGATIVE

## 2018-10-10 MED ORDER — METRONIDAZOLE 0.75 % VA GEL: 1.0000 | Freq: Two times a day (BID) | VAGINAL | 0 refills | Status: AC

## 2018-10-11 ENCOUNTER — Telehealth: Payer: Self-pay | Admitting: *Deleted

## 2018-10-11 LAB — URINE CULTURE

## 2018-10-11 LAB — GC/CHLAMYDIA PROBE AMP
Chlamydia trachomatis, NAA: NEGATIVE
Neisseria gonorrhoeae by PCR: NEGATIVE

## 2018-10-11 MED ORDER — NITROFURANTOIN MONOHYD MACRO 100 MG PO CAPS: ORAL_CAPSULE | ORAL | 0 refills | Status: DC

## 2018-10-11 NOTE — Telephone Encounter (Signed)
Spoke with patient, advised as seen below per Leota Sauerseborah Leonard, CNM. Rx for macrobid to verified pharmacy.  Patient verbalizes understanding and is agreeable.   Encounter closed.

## 2018-10-11 NOTE — Telephone Encounter (Signed)
Notes recorded by Leda MinHamm, Maanvi Lecompte N, RN on 10/11/2018 at 1:22 PM EST Left message to call Noreene LarssonJill, [facility] [phone].   ------  Notes recorded by Verner CholLeonard, Deborah S, CNM on 10/11/2018 at 7:09 AM EST Notify patient her GC,Chlamydia screening was negative Urine culture showing E.Coli and appropriate medication. Complete medication. I am sending another prescription for Macrobid for her to have to take after sexual activity. She needs to take one tablet after sexual activity for prevention. Will need rx Macrobid #30 one after sexual activity if symptoms occur take one bid and call office for appointment

## 2019-01-02 ENCOUNTER — Telehealth: Payer: Self-pay | Admitting: Obstetrics and Gynecology

## 2019-01-02 NOTE — Telephone Encounter (Signed)
Call review with Dr. Oscar La, no additional recommendations.   Call returned to patient, advised as seen above. Patient states she is not in town, currently in [place]. Advised patient to seek immediate care at local ER for further evaluation. Return call to office if additional f/u needed with GYN.  Patient verbalizes understanding.   Routing to provider for final review. Patient is agreeable to disposition. Will close encounter.   Cc: Leota Sauers, CNM

## 2019-01-02 NOTE — Telephone Encounter (Signed)
Spoke with patient. Paragard IUD placed 06/10/18. Could feel 1/3 to 1/2" of IUD when changing tampon today. Reports bleeding is "heavy", changing saturtaed super plus tampon q8h. Denies pain, N/V, fever/chills.   Recommended patient go directly to [facility] for further evaluation. Advised I will review with Dr. Oscar La and return call if any additional recommendations. Patient agreeable.

## 2019-01-02 NOTE — Telephone Encounter (Addendum)
Patient is calling to report that her "Em[employer]da Brothers is coming out, and she can feel that about half has come out". Patient reports she is uninsured.

## 2019-01-06 ENCOUNTER — Telehealth: Payer: Self-pay | Admitting: Certified Nurse Midwife

## 2019-01-06 MED ORDER — LEVONORGESTREL 1.5 MG PO TABS: 1.5000 mg | ORAL_TABLET | Freq: Once | ORAL | 0 refills | Status: AC

## 2019-01-06 NOTE — Telephone Encounter (Signed)
Pt is out of town. States Paragard came out on its own during her last cycle. LMP 12/29/2018 ? Pt unsure exact date.  Condom broke during intercourse two days ago.  Has no  health insurance.  Requests Rx for Plan B so that she can use Goodrx to get it affordably.  Advised will discuss with Dr. Oscar La and return call.  Pt agreeable.

## 2019-01-06 NOTE — Telephone Encounter (Signed)
Patient is requesting a prescription for generic plan b. She went to [facility] and learned she would need a prescription for the generic version of plan b.

## 2019-01-06 NOTE — Telephone Encounter (Signed)
Spoke with Dr. Oscar La. Okay for plan B. Ordered to [facility] [employer] [facility] [employer],  will need to have pharmacy in [place] call and have rx transferred.  Pt agreeable.  She will call back with any concerns.

## 2019-08-05 ENCOUNTER — Other Ambulatory Visit: Payer: Self-pay

## 2019-08-05 ENCOUNTER — Ambulatory Visit: Payer: Self-pay | Admitting: Certified Nurse Midwife

## 2019-08-11 NOTE — Progress Notes (Signed)
28 y.o. Single Caucasian female [id number] here with complaint of UTI, with onset off and on for past 2-3 months. Had Macrobid for post coital use and felt better after she completed last dose.  Patient now complaining of urinary frequency/urgency/ and pain with urination. Patient denies fever, chills, nausea or back pain. Urine has odor, but feels she is hydrating well. No new personal products. Patient feels not be related to sexual activity this time unless it is an STD.Marland KitchenAlso having vaginal symptoms of irritation, with wet discharge, no odor. Contraception is Micronor ordered online, which she stopped one month ago. She had IUD that she became worried about and removed herself intact. Has used condoms, but  consistent use. Negative UPT today. Patient trying to consume adequate water intake and decrease soda intake. Desires STD screening with serum also today. No other health issues.  Review of Systems  Constitutional:       Headache  Genitourinary: Positive for dysuria.       Urine odor, thirsty, vaginal itching, discharge,cramping,nausea    O: Healthy female WDWN Affect: Normal, orientation x 3 Skin : warm and dry CVAT: slight on right bilateral Abdomen: positive for suprapubic tenderness  Pelvic exam: External genital area: normal, no lesions Bladder,Urethra tender, Urethral meatus: slightly tender, red Vagina:normal appearance with  white thick vaginal discharge noted, Affirm taken  Cervix: normal, non tender Uterus:normal,non tender Adnexa: normal non tender, no fullness or masses  upt-neg A: UTI Normal pelvic exam R/O vaginal /STD infection  P: Reviewed findings of UTI and need for treatment. BW:[id number] see order with instructions [id number]  culture Reviewed warning signs and symptoms of UTI and need to advise if occurring. Encouraged to limit soda, tea, and coffee and be sure to increase water intake. Discussed importance of condoms for contraception and STD protection. Lab:  GC,Chlamydia, Affirm, Hep.C, HIV,RPR Will treat as indicated.   RV prn

## 2019-08-12 ENCOUNTER — Ambulatory Visit (INDEPENDENT_AMBULATORY_CARE_PROVIDER_SITE_OTHER): Payer: Self-pay | Admitting: Certified Nurse Midwife

## 2019-08-12 ENCOUNTER — Other Ambulatory Visit: Payer: Self-pay

## 2019-08-12 ENCOUNTER — Encounter: Payer: Self-pay | Admitting: Certified Nurse Midwife

## 2019-08-12 VITALS — BP 120/80 | HR 68 | Temp 97.1°F | Resp 16 | Wt 146.0 lb

## 2019-08-12 DIAGNOSIS — Z113 Encounter for screening for infections with a predominantly sexual mode of transmission: Secondary | ICD-10-CM

## 2019-08-12 DIAGNOSIS — N390 Urinary tract infection, site not specified: Secondary | ICD-10-CM

## 2019-08-12 DIAGNOSIS — N912 Amenorrhea, unspecified: Secondary | ICD-10-CM

## 2019-08-12 DIAGNOSIS — N3000 Acute cystitis without hematuria: Secondary | ICD-10-CM

## 2019-08-12 LAB — POCT URINALYSIS DIPSTICK
Bilirubin, UA: NEGATIVE
Blood, UA: NEGATIVE
Glucose, UA: NEGATIVE
Ketones, UA: NEGATIVE
Leukocytes, UA: NEGATIVE
Nitrite, UA: NEGATIVE
Protein, UA: NEGATIVE
Urobilinogen, UA: NEGATIVE E.U./dL — AB
pH, UA: 5 (ref 5.0–8.0)

## 2019-08-12 LAB — POCT URINE PREGNANCY: Preg Test, Ur: NEGATIVE

## 2019-08-12 MED ORDER — NITROFURANTOIN MONOHYD MACRO 100 MG PO CAPS: 100.0000 mg | ORAL_CAPSULE | Freq: Two times a day (BID) | ORAL | 0 refills | Status: DC

## 2019-08-12 NOTE — Patient Instructions (Signed)
Urinary Tract Infection, Adult A urinary tract infection (UTI) is an infection of any part of the urinary tract. The urinary tract includes:  The kidneys.  The ureters.  The bladder.  The urethra. These organs make, store, and get rid of pee (urine) in the body. What are the causes? This is caused by germs (bacteria) in your genital area. These germs grow and cause swelling (inflammation) of your urinary tract. What increases the risk? You are more likely to develop this condition if:  You have a small, thin tube (catheter) to drain pee.  You cannot control when you pee or poop (incontinence).  You are female, and: ? You use these methods to prevent pregnancy: ? A medicine that kills sperm (spermicide). ? A device that blocks sperm (diaphragm). ? You have low levels of a female hormone (estrogen). ? You are pregnant.  You have genes that add to your risk.  You are sexually active.  You take antibiotic medicines.  You have trouble peeing because of: ? A prostate that is bigger than normal, if you are female. ? A blockage in the part of your body that drains pee from the bladder (urethra). ? A kidney stone. ? A nerve condition that affects your bladder (neurogenic bladder). ? Not getting enough to drink. ? Not peeing often enough.  You have other conditions, such as: ? Diabetes. ? A weak disease-fighting system (immune system). ? Sickle cell disease. ? Gout. ? Injury of the spine. What are the signs or symptoms? Symptoms of this condition include:  Needing to pee right away (urgently).  Peeing often.  Peeing small amounts often.  Pain or burning when peeing.  Blood in the pee.  Pee that smells bad or not like normal.  Trouble peeing.  Pee that is cloudy.  Fluid coming from the vagina, if you are female.  Pain in the belly or lower back. Other symptoms include:  Throwing up (vomiting).  No urge to eat.  Feeling mixed up (confused).  Being tired  and grouchy (irritable).  A fever.  Watery poop (diarrhea). How is this treated? This condition may be treated with:  Antibiotic medicine.  Other medicines.  Drinking enough water. Follow these instructions at home:  Medicines  Take over-the-counter and prescription medicines only as told by your doctor.  If you were prescribed an antibiotic medicine, take it as told by your doctor. Do not stop taking it even if you start to feel better. General instructions  Make sure you: ? Pee until your bladder is empty. ? Do not hold pee for a long time. ? Empty your bladder after sex. ? Wipe from front to back after pooping if you are a female. Use each tissue one time when you wipe.  Drink enough fluid to keep your pee pale yellow.  Keep all follow-up visits as told by your doctor. This is important. Contact a doctor if:  You do not get better after 1-2 days.  Your symptoms go away and then come back. Get help right away if:  You have very bad back pain.  You have very bad pain in your lower belly.  You have a fever.  You are sick to your stomach (nauseous).  You are throwing up. Summary  A urinary tract infection (UTI) is an infection of any part of the urinary tract.  This condition is caused by germs in your genital area.  There are many risk factors for a UTI. These include having a small, thin   tube to drain pee and not being able to control when you pee or poop.  Treatment includes antibiotic medicines for germs.  Drink enough fluid to keep your pee pale yellow. This information is not intended to replace advice given to you by your health care provider. Make sure you discuss any questions you have with your health care provider. Document Released: 03/27/2008 Document Revised: 09/26/2018 Document Reviewed: 04/18/2018 Elsevier Patient Education  2020 Elsevier Inc.  

## 2019-08-13 ENCOUNTER — Telehealth: Payer: Self-pay

## 2019-08-13 LAB — VAGINITIS/VAGINOSIS, DNA PROBE
Candida Species: POSITIVE — AB
Gardnerella vaginalis: NEGATIVE
Trichomonas vaginosis: NEGATIVE

## 2019-08-13 NOTE — Telephone Encounter (Signed)
-----   Message from Regina Eck, CNM sent at 08/13/2019 12:46 PM EDT ----- Notify patient her vaginal screen was positive for yeast, negative for BV and trichomonas. She will need Rx Diflucan one today and repeat one in 5 days. HIV and Hep C are negative, all others are pending.

## 2019-08-13 NOTE — Telephone Encounter (Signed)
Left message for call back.

## 2019-08-14 LAB — GC/CHLAMYDIA PROBE AMP
Chlamydia trachomatis, NAA: NEGATIVE
Neisseria Gonorrhoeae by PCR: NEGATIVE

## 2019-08-14 LAB — RPR: RPR Ser Ql: NONREACTIVE

## 2019-08-14 LAB — HIV ANTIBODY (ROUTINE TESTING W REFLEX): HIV Screen 4th Generation wRfx: NONREACTIVE

## 2019-08-14 LAB — URINE CULTURE: Organism ID, Bacteria: NO GROWTH

## 2019-08-14 LAB — HEPATITIS C ANTIBODY: Hep C Virus Ab: <0.1 s/co ratio (ref 0.0–0.9)

## 2019-08-14 MED ORDER — FLUCONAZOLE 150 MG PO TABS: ORAL_TABLET | ORAL | 0 refills | Status: DC

## 2019-08-14 NOTE — Telephone Encounter (Signed)
Spoke with patient, advised per Melvia Heaps, CNM. RX for diflucan to verified pharmacy. Patient verbalizes understanding and is agreeable.   Encounter closed.

## 2019-08-14 NOTE — Telephone Encounter (Signed)
Patient returning call to [facility] about lab results.

## 2020-01-05 ENCOUNTER — Encounter: Payer: Self-pay | Admitting: Certified Nurse Midwife

## 2020-01-07 ENCOUNTER — Encounter: Payer: Self-pay | Admitting: Certified Nurse Midwife

## 2020-04-11 ENCOUNTER — Other Ambulatory Visit: Payer: Self-pay

## 2020-04-11 ENCOUNTER — Emergency Department (HOSPITAL_COMMUNITY)
Admission: EM | Admit: 2020-04-11 | Discharge: 2020-04-12 | Disposition: A | Discharge status: Home / Self Care | Payer: No Typology Code available for payment source | Attending: Emergency Medicine | Admitting: Emergency Medicine

## 2020-04-11 ENCOUNTER — Encounter (HOSPITAL_COMMUNITY): Payer: Self-pay | Admitting: Emergency Medicine

## 2020-04-11 DIAGNOSIS — F10129 Alcohol abuse with intoxication, unspecified: Secondary | ICD-10-CM | POA: Insufficient documentation

## 2020-04-11 DIAGNOSIS — Z5321 Procedure and treatment not carried out due to patient leaving prior to being seen by health care provider: Secondary | ICD-10-CM | POA: Insufficient documentation

## 2020-04-11 LAB — URINALYSIS, ROUTINE W REFLEX MICROSCOPIC
Bilirubin Urine: NEGATIVE
Glucose, UA: NEGATIVE mg/dL
Hgb urine dipstick: NEGATIVE
Ketones, ur: NEGATIVE mg/dL
Leukocytes,Ua: NEGATIVE
Nitrite: NEGATIVE
Protein, ur: NEGATIVE mg/dL
Specific Gravity, Urine: 1.002 — ABNORMAL LOW (ref 1.005–1.030)
pH: 7 (ref 5.0–8.0)

## 2020-04-11 LAB — CBC
HCT: 41.1 % (ref 36.0–46.0)
Hemoglobin: 13.1 g/dL (ref 12.0–15.0)
MCH: 30 pg (ref 26.0–34.0)
MCHC: 31.9 g/dL (ref 30.0–36.0)
MCV: 94.3 fL (ref 80.0–100.0)
Platelets: 364 10*3/uL (ref 150–400)
RBC: 4.36 MIL/uL (ref 3.87–5.11)
RDW: 12.4 % (ref 11.5–15.5)
WBC: 9.5 10*3/uL (ref 4.0–10.5)
nRBC: 0 % (ref 0.0–0.2)

## 2020-04-11 LAB — I-STAT BETA HCG BLOOD, ED (MC, WL, AP ONLY): I-stat hCG, quantitative: <5 m[IU]/mL (ref <5)

## 2020-04-11 NOTE — ED Triage Notes (Signed)
Patient reports drinking daily x2 years. States decided to stop drinking. Last drink x1 week ago. Reports diarrhea, anxiety, and pain all over.

## 2020-04-12 LAB — COMPREHENSIVE METABOLIC PANEL WITH GFR
ALT: 18 U/L (ref 0–44)
AST: 21 U/L (ref 15–41)
Albumin: 4.6 g/dL (ref 3.5–5.0)
Alkaline Phosphatase: 72 U/L (ref 38–126)
Anion gap: 8 (ref 5–15)
BUN: 8 mg/dL (ref 6–20)
CO2: 27 mmol/L (ref 22–32)
Calcium: 9 mg/dL (ref 8.9–10.3)
Chloride: 104 mmol/L (ref 98–111)
Creatinine, Ser: 0.76 mg/dL (ref 0.44–1.00)
GFR calc Af Amer: >60 mL/min
GFR calc non Af Amer: >60 mL/min
Glucose, Bld: 83 mg/dL (ref 70–99)
Potassium: 3.9 mmol/L (ref 3.5–5.1)
Sodium: 139 mmol/L (ref 135–145)
Total Bilirubin: 0.5 mg/dL (ref 0.3–1.2)
Total Protein: 7.3 g/dL (ref 6.5–8.1)

## 2020-04-12 LAB — LIPASE, BLOOD: Lipase: 48 U/L (ref 11–51)

## 2020-11-24 ENCOUNTER — Other Ambulatory Visit: Payer: Self-pay

## 2020-11-24 ENCOUNTER — Ambulatory Visit (INDEPENDENT_AMBULATORY_CARE_PROVIDER_SITE_OTHER): Payer: Self-pay | Admitting: Nurse Practitioner

## 2020-11-24 ENCOUNTER — Encounter: Payer: Self-pay | Admitting: Nurse Practitioner

## 2020-11-24 ENCOUNTER — Ambulatory Visit: Payer: Self-pay | Admitting: Nurse Practitioner

## 2020-11-24 VITALS — BP 122/80 | Ht 61.0 in | Wt 149.0 lb

## 2020-11-24 DIAGNOSIS — Z30011 Encounter for initial prescription of contraceptive pills: Secondary | ICD-10-CM

## 2020-11-24 DIAGNOSIS — Z01419 Encounter for gynecological examination (general) (routine) without abnormal findings: Secondary | ICD-10-CM

## 2020-11-24 DIAGNOSIS — R3989 Other symptoms and signs involving the genitourinary system: Secondary | ICD-10-CM

## 2020-11-24 DIAGNOSIS — R1031 Right lower quadrant pain: Secondary | ICD-10-CM

## 2020-11-24 DIAGNOSIS — Z113 Encounter for screening for infections with a predominantly sexual mode of transmission: Secondary | ICD-10-CM

## 2020-11-24 DIAGNOSIS — N943 Premenstrual tension syndrome: Secondary | ICD-10-CM

## 2020-11-24 DIAGNOSIS — N926 Irregular menstruation, unspecified: Secondary | ICD-10-CM

## 2020-11-24 LAB — URINALYSIS, COMPLETE W/RFL CULTURE
Bacteria, UA: NONE SEEN /HPF
Bilirubin Urine: NEGATIVE
Glucose, UA: NEGATIVE
Hgb urine dipstick: NEGATIVE
Hyaline Cast: NONE SEEN /LPF
Ketones, ur: NEGATIVE
Leukocyte Esterase: NEGATIVE
Nitrites, Initial: NEGATIVE
Protein, ur: NEGATIVE
RBC / HPF: NONE SEEN /HPF (ref 0–2)
Specific Gravity, Urine: 1.015 (ref 1.001–1.03)
WBC, UA: NONE SEEN /HPF (ref 0–5)
pH: 8 (ref 5.0–8.0)

## 2020-11-24 LAB — NO CULTURE INDICATED

## 2020-11-24 MED ORDER — NORETHIN ACE-ETH ESTRAD-FE 1-20 MG-MCG PO TABS: 1.0000 | ORAL_TABLET | Freq: Every day | ORAL | 2 refills | Status: DC

## 2020-11-24 NOTE — Progress Notes (Signed)
Jane Gonzalez [birth year] [id number]   History:  30 y.o. G0 presents for annual exam with multiple complaints. Right low abdominal pain that radiates to mid abdomen and back x 1 month. Pain is constant, worse with activity, and better with rest. Says sometime pain is severe and feels like a "knot/grinding pain". History of irregular cycles with severe dysmenorrhea and intermittent abdominal pain and bloating throughout cycle. Family history of endometriosis in multiple family members, mother had hysterectomy at age 28. Patient wants hysterectomy as pain interferes with her daily life and she does not want children. She has tried mini-pill, Depo Provera, Nexplanon, and Paraguard IUD (fell out in 2019). She has not been taking anything since then. She thinks she tried combination birth control pill and/or patch and was worried it was affecting her moods. Psychiatry manages anxiety and depression, not currently in counseling but is looking for new therapist. She reports suicidal thoughts and worsening depression a few days before menses and during. Normal pap history. She has had 1 dose of Gardasil, declines others. Sexually actively about 8 times per year. She would like STD testing today.   Gynecologic History Patient's last menstrual period was 10/23/2020. Period Pattern: (!) Irregular Menstrual Flow: Moderate,Heavy Menstrual Control: Tampon,Maxi pad Dysmenorrhea: (!) Severe Dysmenorrhea Symptoms: Cramping Contraception/Family planning: none  Health Maintenance Last Pap: 05/2015. Results were: normal  Past medical history, past surgical history, family history and social history were all reviewed and documented in the EPIC chart.  ROS:  A ROS was performed and pertinent positives and negatives are included.  Exam:  Vitals:   11/24/20 1345  Weight: 149 lb (67.6 kg)  Height: 5\' 1"  (1.549 m)   Body mass index is 28.15 kg/m.  General appearance:  Normal Thyroid:  Symmetrical, normal in  size, without palpable masses or nodularity. Respiratory  Auscultation:  Clear without wheezing or rhonchi Cardiovascular  Auscultation:  Regular rate, without rubs, murmurs or gallops  Edema/varicosities:  Not grossly evident Abdominal  Soft, tender with palpation in RLQ and mid suprapubic region with some guarding in RLQ. No gross masses or rebound pain.  Liver/spleen:  No organomegaly noted  Hernia:  None appreciated  Skin  Inspection:  Grossly normal   Breasts: Examined lying and sitting.   Right: Without masses, retractions, discharge or axillary adenopathy.   Left: Without masses, retractions, discharge or axillary adenopathy. Gentitourinary   Inguinal/mons:  Normal without inguinal adenopathy  External genitalia:  Normal  BUS/Urethra/Skene's glands:  Normal  Vagina:  Normal  Cervix:  Normal  Uterus:  Normal in size, shape and contour.  Midline and mobile  Adnexa/parametria:     Rt: Without masses or tenderness.   Lt: Without masses or tenderness.  Anus and perineum: Normal  UA negative  Assessment/Plan:  30 y.o. G0 for annual exam with multiple complaints.   Well female exam with routine gynecological exam - Plan: CBC with Differential/Platelet, Comprehensive metabolic panel, Pap IG w/ reflex to HPV when ASC-U. Education provided on SBEs, importance of preventative screenings, current guidelines, high calcium diet, regular exercise, and multivitamin daily.   Screen for STD (sexually transmitted disease) - Plan: SURESWAB CT/NG/T. vaginalis, HIV Antibody (routine testing w rflx), RPR  Irregular menses - Plan: TSH, 26 PELVIS TRANSVAGINAL NON-OB (TV ONLY), norethindrone-ethinyl estradiol (LOESTRIN FE) 1-20 MG-MCG tablet. This is not new for her but due to worsening dysmenorrhea and irregularity we will rule out any uterine abnormalities.   Probable endometriosis - we discussed that hysterectomy is not first-line treatment for  endometriosis, especially at 30. She is very  sure she wants a hysterectomy and is aware of the costs. She has no desire to have children. I recommended we try OCPs, rule out any uterine abnormalities, and discuss further treatment options. Discussed that endometriosis is diagnosed by laparoscopic procedure only. If she is still considering hysterectomy/surgical options I will have her schedule office visit with Dr. Edward Jolly for further discussion regarding these. Patient is agreeable.   PMS (premenstrual syndrome) - Plan: norethindrone-ethinyl estradiol (LOESTRIN FE) 1-20 MG-MCG tablet daily. She experiences worsening depression with suicidal thoughts a few days before menses and during. Recommend taking continuously to prevent fluctuations in estrogen levels with hopes to manage PMS symptoms. Also recommend counseling. She is in the process of finding a therapist and sees psych for management of depression and anxiety. She is currently taking Cymbalta and Klonopin.   Right lower quadrant abdominal pain - Plan: US PELVIS TRANSVAGINAL NON-OB (TV ONLY). CBC. Right low abdominal pain that radiates to mid abdomen and back x 1 month. Pain is constant, worse with activity, and better with rest. Says sometime pain is severe and feels like a "knot/grinding pain". Pain is likely to be from endometriosis based on clinical picture and significant family history. We discussed that endometriosis is diagnosed by laparoscopic procedure. Ultrasound to rule out ovarian cyst.   Encounter for initial prescription of contraceptive pills - Plan: norethindrone-ethinyl estradiol (LOESTRIN FE) 1-20 MG-MCG tablet daily. She is aware of proper use and slight risk for blood clots.   Urethral pain - Plan: Urinalysis,Complete w/RFL Culture. UA negative. No evidence of urethritis.   Screening for cervical cancer - normal pap history. Last pap in 2016. Pap with reflex today.  Follow up in 1 year for annual. She is agreeable to plan.       Olivia Mackie [facility], 1:54 PM  11/24/2020

## 2020-11-24 NOTE — Patient Instructions (Signed)
Health Maintenance, Female Adopting a healthy lifestyle and getting preventive care are important in promoting health and wellness. Ask your health care provider about:  The right schedule for you to have regular tests and exams.  Things you can do on your own to prevent diseases and keep yourself healthy. What should I know about diet, weight, and exercise? Eat a healthy diet  Eat a diet that includes plenty of vegetables, fruits, low-fat dairy products, and lean protein.  Do not eat a lot of foods that are high in solid fats, added sugars, or sodium.   Maintain a healthy weight Body mass index (BMI) is used to identify weight problems. It estimates body fat based on height and weight. Your health care provider can help determine your BMI and help you achieve or maintain a healthy weight. Get regular exercise Get regular exercise. This is one of the most important things you can do for your health. Most adults should:  Exercise for at least 150 minutes each week. The exercise should increase your heart rate and make you sweat (moderate-intensity exercise).  Do strengthening exercises at least twice a week. This is in addition to the moderate-intensity exercise.  Spend less time sitting. Even light physical activity can be beneficial. Watch cholesterol and blood lipids Have your blood tested for lipids and cholesterol at 29 years of age, then have this test every 5 years. Have your cholesterol levels checked more often if:  Your lipid or cholesterol levels are high.  You are older than 29 years of age.  You are at high risk for heart disease. What should I know about cancer screening? Depending on your health history and family history, you may need to have cancer screening at various ages. This may include screening for:  Breast cancer.  Cervical cancer.  Colorectal cancer.  Skin cancer.  Lung cancer. What should I know about heart disease, diabetes, and high blood  pressure? Blood pressure and heart disease  High blood pressure causes heart disease and increases the risk of stroke. This is more likely to develop in people who have high blood pressure readings, are of African descent, or are overweight.  Have your blood pressure checked: ? Every 3-5 years if you are 18-39 years of age. ? Every year if you are 40 years old or older. Diabetes Have regular diabetes screenings. This checks your fasting blood sugar level. Have the screening done:  Once every three years after age 40 if you are at a normal weight and have a low risk for diabetes.  More often and at a younger age if you are overweight or have a high risk for diabetes. What should I know about preventing infection? Hepatitis B If you have a higher risk for hepatitis B, you should be screened for this virus. Talk with your health care provider to find out if you are at risk for hepatitis B infection. Hepatitis C Testing is recommended for:  Everyone born from 1945 through 1965.  Anyone with known risk factors for hepatitis C. Sexually transmitted infections (STIs)  Get screened for STIs, including gonorrhea and chlamydia, if: ? You are sexually active and are younger than 29 years of age. ? You are older than 29 years of age and your health care provider tells you that you are at risk for this type of infection. ? Your sexual activity has changed since you were last screened, and you are at increased risk for chlamydia or gonorrhea. Ask your health care provider   if you are at risk.  Ask your health care provider about whether you are at high risk for HIV. Your health care provider may recommend a prescription medicine to help prevent HIV infection. If you choose to take medicine to prevent HIV, you should first get tested for HIV. You should then be tested every 3 months for as long as you are taking the medicine. Pregnancy  If you are about to stop having your period (premenopausal) and  you may become pregnant, seek counseling before you get pregnant.  Take 400 to 800 micrograms (mcg) of folic acid every day if you become pregnant.  Ask for birth control (contraception) if you want to prevent pregnancy. Osteoporosis and menopause Osteoporosis is a disease in which the bones lose minerals and strength with aging. This can result in bone fractures. If you are 65 years old or older, or if you are at risk for osteoporosis and fractures, ask your health care provider if you should:  Be screened for bone loss.  Take a calcium or vitamin D supplement to lower your risk of fractures.  Be given hormone replacement therapy (HRT) to treat symptoms of menopause. Follow these instructions at home: Lifestyle  Do not use any products that contain nicotine or tobacco, such as cigarettes, e-cigarettes, and chewing tobacco. If you need help quitting, ask your health care provider.  Do not use street drugs.  Do not share needles.  Ask your health care provider for help if you need support or information about quitting drugs. Alcohol use  Do not drink alcohol if: ? Your health care provider tells you not to drink. ? You are pregnant, may be pregnant, or are planning to become pregnant.  If you drink alcohol: ? Limit how much you use to 0-1 drink a day. ? Limit intake if you are breastfeeding.  Be aware of how much alcohol is in your drink. In the [place] one drink equals one 12 oz bottle of beer (355 mL), one 5 oz glass of wine (148 mL), or one 1 oz glass of hard liquor (44 mL). General instructions  Schedule regular health, dental, and eye exams.  Stay current with your vaccines.  Tell your health care provider if: ? You often feel depressed. ? You have ever been abused or do not feel safe at home. Summary  Adopting a healthy lifestyle and getting preventive care are important in promoting health and wellness.  Follow your health care provider's instructions about healthy  diet, exercising, and getting tested or screened for diseases.  Follow your health care provider's instructions on monitoring your cholesterol and blood pressure. This information is not intended to replace advice given to you by your health care provider. Make sure you discuss any questions you have with your health care provider. Document Revised: 10/02/2018 Document Reviewed: 10/02/2018 Elsevier Patient Education  2021 Elsevier Inc.  

## 2020-11-25 LAB — CBC WITH DIFFERENTIAL/PLATELET
Absolute Monocytes: 497 cells/uL (ref 200–950)
Basophils Absolute: 28 cells/uL (ref 0–200)
Basophils Relative: 0.4 %
Eosinophils Absolute: 92 cells/uL (ref 15–500)
Eosinophils Relative: 1.3 %
HCT: 38.4 % (ref 35.0–45.0)
Hemoglobin: 13 g/dL (ref 11.7–15.5)
Lymphs Abs: 1768 cells/uL (ref 850–3900)
MCH: 29 pg (ref 27.0–33.0)
MCHC: 33.9 g/dL (ref 32.0–36.0)
MCV: 85.7 fL (ref 80.0–100.0)
MPV: 9.2 fL (ref 7.5–12.5)
Monocytes Relative: 7 %
Neutro Abs: 4714 cells/uL (ref 1500–7800)
Neutrophils Relative %: 66.4 %
Platelets: 355 10*3/uL (ref 140–400)
RBC: 4.48 10*6/uL (ref 3.80–5.10)
RDW: 13 % (ref 11.0–15.0)
Total Lymphocyte: 24.9 %
WBC: 7.1 10*3/uL (ref 3.8–10.8)

## 2020-11-25 LAB — COMPREHENSIVE METABOLIC PANEL
AG Ratio: 2 (calc) (ref 1.0–2.5)
ALT: 10 U/L (ref 6–29)
AST: 13 U/L (ref 10–30)
Albumin: 4.3 g/dL (ref 3.6–5.1)
Alkaline phosphatase (APISO): 67 U/L (ref 31–125)
BUN/Creatinine Ratio: 9 (calc) (ref 6–22)
BUN: 6 mg/dL — ABNORMAL LOW (ref 7–25)
CO2: 22 mmol/L (ref 20–32)
Calcium: 9.2 mg/dL (ref 8.6–10.2)
Chloride: 107 mmol/L (ref 98–110)
Creat: 0.69 mg/dL (ref 0.50–1.10)
Globulin: 2.1 g/dL (calc) (ref 1.9–3.7)
Glucose, Bld: 78 mg/dL (ref 65–99)
Potassium: 4.2 mmol/L (ref 3.5–5.3)
Sodium: 138 mmol/L (ref 135–146)
Total Bilirubin: 0.6 mg/dL (ref 0.2–1.2)
Total Protein: 6.4 g/dL (ref 6.1–8.1)

## 2020-11-25 LAB — PAP IG W/ RFLX HPV ASCU

## 2020-11-25 LAB — HIV ANTIBODY (ROUTINE TESTING W REFLEX): HIV 1&2 Ab, 4th Generation: NONREACTIVE

## 2020-11-25 LAB — SURESWAB CT/NG/T. VAGINALIS
C. trachomatis RNA, TMA: NOT DETECTED
N. gonorrhoeae RNA, TMA: NOT DETECTED
Trichomonas vaginalis RNA: NOT DETECTED

## 2020-11-25 LAB — TSH: TSH: 0.95 mIU/L

## 2020-11-25 LAB — RPR: RPR Ser Ql: NONREACTIVE

## 2020-11-30 ENCOUNTER — Telehealth: Payer: Self-pay | Admitting: *Deleted

## 2020-11-30 NOTE — Telephone Encounter (Signed)
Tiffany I spoke with patient and informed her that the outpatient centers do not have anything sooner than her scheduled appointment on 12/02/20. I called and was informed the next available appointments would be in 2 weeks. Patient said she understood, she will keep the appointment scheduled on Thursday. Aware if pain is too severe to report to ER.  Patient verbalized she understood.

## 2020-11-30 NOTE — Telephone Encounter (Signed)
Thank you Jennifer.

## 2020-12-02 ENCOUNTER — Ambulatory Visit (INDEPENDENT_AMBULATORY_CARE_PROVIDER_SITE_OTHER): Payer: Self-pay

## 2020-12-02 ENCOUNTER — Ambulatory Visit (INDEPENDENT_AMBULATORY_CARE_PROVIDER_SITE_OTHER): Payer: Self-pay | Admitting: Nurse Practitioner

## 2020-12-02 ENCOUNTER — Other Ambulatory Visit: Payer: Self-pay | Admitting: Nurse Practitioner

## 2020-12-02 ENCOUNTER — Encounter: Payer: Self-pay | Admitting: Nurse Practitioner

## 2020-12-02 ENCOUNTER — Other Ambulatory Visit: Payer: Self-pay

## 2020-12-02 VITALS — BP 115/78

## 2020-12-02 DIAGNOSIS — R1031 Right lower quadrant pain: Secondary | ICD-10-CM

## 2020-12-02 DIAGNOSIS — N926 Irregular menstruation, unspecified: Secondary | ICD-10-CM

## 2020-12-02 NOTE — Progress Notes (Signed)
History: 30 year old presents today to discuss ultrasound.  Was seen 11/24/2020 with complaints of right lower abdominal pain that radiates to mid abdomen and back x1 month.  She describes the pain as constant, worse with activity, and better with rest.  Says sometimes the pain is severe.  At that visit she discussed interest in wanting a hysterectomy.  She has a strong family history of endometriosis and mother had hysterectomy in her 67s.  At that visit we discussed that hysterectomy is not first-line treatment for endometriosis, especially at her age and discussed other management options. Today her pain is minimal and she can pinpoint exactly where the pain is located, which is right upper groin. She has started Loestrin since last visit and is tolerating well. She does report today that she has been exercising and doing splits.  She has a history of IBS and hiatal hernia.  Says bowel movements range from diarrhea to constipation.  Exam: Appears well. Abdomen: palpated area of concern at upper right groin - some tenderness, no redness, no mass felt.  Ultrasound: Retroverted uterus, normal size and shape, no myometrial masses.  Thin, symmetrical endometrium, no masses or thickening seen - 2.9 mm.  Both ovaries normal size with normal follicle pattern.  Thick walled collapsed C.L. right ovary 6 mm.  No adnexal masses. Mild, clear free fluid seen anterior cul-de-sac.  Assessment: Right lower quadrant abdominal pain  Plan: Discussed ultrasound and reassurance provided on normal findings.  Again we discussed endometriosis and that diagnosis is made through laparoscopic procedure.  Recommend continuing OCPs for management of PMS and dysmenorrhea.  Also discussed GI referral but she wants to wait on this.  Pain could be musculoskeletal, so we discussed avoiding splits and twisting motions to see if this improves.  She is agreeable to try OCPs for 3 to 6 months and if no improvement we will discuss other  management options.  She is agreeable to plan.

## 2021-01-07 ENCOUNTER — Emergency Department (HOSPITAL_COMMUNITY): Payer: No Typology Code available for payment source

## 2021-01-07 ENCOUNTER — Emergency Department (HOSPITAL_COMMUNITY)
Admission: EM | Admit: 2021-01-07 | Discharge: 2021-01-07 | Disposition: A | Discharge status: Home / Self Care | Payer: No Typology Code available for payment source | Attending: Emergency Medicine | Admitting: Emergency Medicine

## 2021-01-07 ENCOUNTER — Encounter (HOSPITAL_COMMUNITY): Payer: Self-pay | Admitting: Emergency Medicine

## 2021-01-07 ENCOUNTER — Other Ambulatory Visit: Payer: Self-pay

## 2021-01-07 DIAGNOSIS — R0789 Other chest pain: Secondary | ICD-10-CM | POA: Insufficient documentation

## 2021-01-07 DIAGNOSIS — M542 Cervicalgia: Secondary | ICD-10-CM | POA: Insufficient documentation

## 2021-01-07 DIAGNOSIS — R1030 Lower abdominal pain, unspecified: Secondary | ICD-10-CM | POA: Insufficient documentation

## 2021-01-07 DIAGNOSIS — Z87891 Personal history of nicotine dependence: Secondary | ICD-10-CM | POA: Insufficient documentation

## 2021-01-07 MED ORDER — IBUPROFEN 600 MG PO TABS
600.0000 mg | ORAL_TABLET | Freq: Four times a day (QID) | ORAL | 0 refills | Status: DC | PRN
Start: 2021-01-07 — End: 2021-04-28

## 2021-01-07 MED ORDER — OXYCODONE-ACETAMINOPHEN 5-325 MG PO TABS
1.0000 | ORAL_TABLET | Freq: Once | ORAL | Status: AC
  Administered 2021-01-07: 1 via ORAL
  Filled 2021-01-07: qty 1

## 2021-01-07 MED ORDER — CYCLOBENZAPRINE HCL 10 MG PO TABS
10.0000 mg | ORAL_TABLET | Freq: Two times a day (BID) | ORAL | 0 refills | Status: DC | PRN
Start: 2021-01-07 — End: 2021-03-28

## 2021-01-07 NOTE — ED Notes (Signed)
Pt reports she will sign waiver instead of providing urine sample for preg test
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
## 2021-03-23 ENCOUNTER — Ambulatory Visit: Payer: Self-pay | Admitting: Nurse Practitioner

## 2021-03-28 ENCOUNTER — Ambulatory Visit (INDEPENDENT_AMBULATORY_CARE_PROVIDER_SITE_OTHER): Payer: Self-pay | Admitting: Nurse Practitioner

## 2021-03-28 ENCOUNTER — Encounter: Payer: Self-pay | Admitting: Nurse Practitioner

## 2021-03-28 VITALS — BP 120/76

## 2021-03-28 DIAGNOSIS — N946 Dysmenorrhea, unspecified: Secondary | ICD-10-CM

## 2021-03-28 DIAGNOSIS — R1031 Right lower quadrant pain: Secondary | ICD-10-CM

## 2021-03-28 DIAGNOSIS — Z113 Encounter for screening for infections with a predominantly sexual mode of transmission: Secondary | ICD-10-CM

## 2021-03-28 DIAGNOSIS — Z30011 Encounter for initial prescription of contraceptive pills: Secondary | ICD-10-CM

## 2021-03-28 DIAGNOSIS — N898 Other specified noninflammatory disorders of vagina: Secondary | ICD-10-CM

## 2021-03-28 DIAGNOSIS — N920 Excessive and frequent menstruation with regular cycle: Secondary | ICD-10-CM

## 2021-03-28 LAB — PREGNANCY, URINE: Preg Test, Ur: NEGATIVE

## 2021-03-28 LAB — WET PREP FOR TRICH, YEAST, CLUE

## 2021-03-28 MED ORDER — NORETHINDRONE 0.35 MG PO TABS: 1.0000 | ORAL_TABLET | Freq: Every day | ORAL | 11 refills | Status: AC

## 2021-03-28 NOTE — Progress Notes (Signed)
Acute Office Visit  Subjective:    Patient ID: Jane Gonzalez, female    DOB: 01-19-1991, 30 y.o.   MRN: [id number]   HPI 30 y.o. presents today for episode of dark vaginal discharge and vomiting that occurred a couple of weeks ago. Sexual intercourse with new partner 1 month ago where condom broke. Stopped OCPs in February after taking for 2 weeks due to severe changes in mood. Took old mini pills she had at home recently, which is when the dark spotting and vomiting occurred. She also has RLQ pain that is not new for her and is assumed to be endometriosis. She has a history of irregular cycles and severe dysmenorrhea. Family history of endometriosis, reports mother had hysterectomy at 73. She has tried multiple hormonal contraception methods for management (POPs, OCPs, Nexplanon, and Depo provera), as well as paraguard IUD that was expelled, but does not tolerate any due to severe changes in mood.    Review of Systems  Constitutional: Negative.   Gastrointestinal: Positive for abdominal pain.  Genitourinary: Positive for menstrual problem. Negative for dyspareunia, vaginal discharge and vaginal pain.  Psychiatric/Behavioral: Positive for dysphoric mood. The patient is nervous/anxious.        Objective:    Physical Exam Constitutional:      Appearance: Normal appearance.  Abdominal:     Tenderness: There is abdominal tenderness in the right lower quadrant. There is no guarding or rebound.  Genitourinary:    General: Normal vulva.     Vagina: Normal.     Cervix: Normal.     Uterus: Normal.      BP 120/76   LMP 03/14/2021  Wt Readings from Last 3 Encounters:  01/07/21 150 lb (68 kg)  11/24/20 149 lb (67.6 kg)  08/12/19 146 lb (66.2 kg)   Wet prep negative UPT negative     Assessment & Plan:   Problem List Items Addressed This Visit   None   Visit Diagnoses    Screen for STD (sexually transmitted disease)    -  Primary   Relevant Orders   SURESWAB CT/NG/T. vaginalis    Vaginal discharge       Relevant Orders   WET PREP FOR TRICH, YEAST, CLUE (Completed)   Right lower quadrant abdominal pain       Spotting       Relevant Orders   Pregnancy, urine (Completed)   Encounter for initial prescription of contraceptive pills       Relevant Medications   norethindrone (ERRIN) 0.35 MG tablet     Plan: Reassurance provided that dark discharge was likely old blood from menses. Unable to identify cause for vomiting episodes but reassured it was likely from stomach virus, bad food, anxiety, or POPs she started retaking. UPT negative. STD screening pending. Discussed limitations of management options for dysmenorrhea and irregular menses. We have had a long discussion at previous visit about diagnosis and management of endometriosis. She does not have insurance so right now she does not want to proceed with anything. She does want to try POPs again (Errin to be exact). Explained that this is the same as the medication she took during the vomiting episode but she would like to retry it anyway. Discussed proper use. All questions answered but she wants to know exact reason for vomiting and informed that there is no way to know exactly why she had that episode. She is agreeable to plan.      Olivia Mackie DNP, 4:45 PM 03/28/2021

## 2021-03-29 ENCOUNTER — Other Ambulatory Visit: Payer: Self-pay | Admitting: Nurse Practitioner

## 2021-03-29 DIAGNOSIS — A749 Chlamydial infection, unspecified: Secondary | ICD-10-CM

## 2021-03-29 LAB — SURESWAB CT/NG/T. VAGINALIS
C. trachomatis RNA, TMA: DETECTED — AB
N. gonorrhoeae RNA, TMA: NOT DETECTED
Trichomonas vaginalis RNA: NOT DETECTED

## 2021-03-29 MED ORDER — DOXYCYCLINE MONOHYDRATE 100 MG PO CAPS: 100.0000 mg | ORAL_CAPSULE | Freq: Two times a day (BID) | ORAL | 0 refills | Status: AC

## 2021-04-28 ENCOUNTER — Ambulatory Visit (INDEPENDENT_AMBULATORY_CARE_PROVIDER_SITE_OTHER): Payer: Self-pay | Admitting: Obstetrics & Gynecology

## 2021-04-28 ENCOUNTER — Encounter: Payer: Self-pay | Admitting: Obstetrics & Gynecology

## 2021-04-28 ENCOUNTER — Other Ambulatory Visit: Payer: Self-pay

## 2021-04-28 VITALS — BP 110/70 | HR 104 | Temp 98.2°F | Resp 16

## 2021-04-28 DIAGNOSIS — Z8619 Personal history of other infectious and parasitic diseases: Secondary | ICD-10-CM

## 2021-04-28 DIAGNOSIS — N898 Other specified noninflammatory disorders of vagina: Secondary | ICD-10-CM

## 2021-04-28 DIAGNOSIS — R300 Dysuria: Secondary | ICD-10-CM

## 2021-04-28 LAB — WET PREP FOR TRICH, YEAST, CLUE

## 2021-04-28 NOTE — Addendum Note (Signed)
Addended by: Genia Del on: 04/28/2021 10:27 AM   Modules accepted: Orders

## 2021-04-28 NOTE — Progress Notes (Addendum)
w   Jonny Ruiz 1990-12-31 [id number]        30 y.o.  G0 Boyfriend  RP: Vulvar irritation/vaginal discharge and dysuria  HPI: C/O vulvar irritation, vaginal discharge and pain when urinating.  No fever.  Positive Chlam treated with Doxy 100 mg BID x 7 days in 03/2021.  Per patient, her boyfriend got treated, but unsure if he took the antibiotics well.  Sexually active with him without condoms since treatment.  Menstrual period currently, light flow.  Usual endometriosis like pains on off.    OB History  Gravida Para Term Preterm AB Living  0 0 0 0 0 0  SAB IAB Ectopic Multiple Live Births  0 0 0 0      Past medical history,surgical history, problem list, medications, allergies, family history and social history were all reviewed and documented in the EPIC chart.   Directed ROS with pertinent positives and negatives documented in the history of present illness/assessment and plan.  Exam:  Vitals:   04/28/21 0850  BP: 110/70  Pulse: (!) 104  Resp: 16  Temp: 98.2 F (36.8 C)  TempSrc: Oral   General appearance:  Normal  Abdomen: Normal  Gynecologic exam: Vulva with minimal irritation.  Speculum:  Cervix/Vagina normal.  Normal secretions.  Gono-Chlam and wet prep done.  Bimanual exam:  Uterus NT, mobile, normal volume.  No adnexal mass, NT bilaterally.  U/A: Yellow clear, protein negative, nitrite negative, white blood cells 0-5, red blood cells 0-2, bacteria few.  No yeast.  Urine culture pending. Wet prep Neg   Assessment/Plan:  30 y.o. G0P0000   1. Dysuria Urine analysis minimally perturbed.  Will wait on U. Culture to decide if ABTx is needed. - Urinalysis,Complete w/RFL Culture  2. Vaginal discharge Wet prep negative.  Will try Boric Acid OTC to help restore her flora and prevent eventual infections.. - WET PREP FOR TRICH, YEAST, CLUE  3. H/O chlamydia infection  Sexually active with same partner since treatment.  Uncertain as to whether her partner got an  adequate treatment.  No evidence of PID.  TOC Chlamydia done.  Will wait on results to decide if retreatment is needed. - C. trachomatis/N. gonorrhoeae RNA   Genia Del MD, 9:08 AM 04/28/2021

## 2021-04-29 LAB — C. TRACHOMATIS/N. GONORRHOEAE RNA
C. trachomatis RNA, TMA: NOT DETECTED
N. gonorrhoeae RNA, TMA: NOT DETECTED

## 2021-04-30 LAB — URINALYSIS, COMPLETE W/RFL CULTURE
Bilirubin Urine: NEGATIVE
Glucose, UA: NEGATIVE
Hyaline Cast: NONE SEEN /LPF
Ketones, ur: NEGATIVE
Leukocyte Esterase: NEGATIVE
Nitrites, Initial: NEGATIVE
Protein, ur: NEGATIVE
Specific Gravity, Urine: 1.015 (ref 1.001–1.035)
pH: 7 (ref 5.0–8.0)

## 2021-04-30 LAB — URINE CULTURE
MICRO NUMBER:: 12091829
SPECIMEN QUALITY:: ADEQUATE

## 2021-04-30 LAB — CULTURE INDICATED

## 2021-05-09 ENCOUNTER — Other Ambulatory Visit: Payer: Self-pay

## 2021-05-09 MED ORDER — CEPHALEXIN 500 MG PO CAPS: 500.0000 mg | ORAL_CAPSULE | Freq: Two times a day (BID) | ORAL | 0 refills | Status: AC
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
